# Patient Record
Sex: Female | Born: 1990 | Race: White | Marital: Single | Smoking: Never smoker
Health system: Southern US, Community
[De-identification: ages and names within clinical notes are randomized; demographics above are authoritative.]

## PROBLEM LIST (undated history)

## (undated) ENCOUNTER — Inpatient Hospital Stay: Payer: Self-pay

## (undated) DIAGNOSIS — F32A Depression, unspecified: Secondary | ICD-10-CM

## (undated) DIAGNOSIS — F419 Anxiety disorder, unspecified: Secondary | ICD-10-CM

## (undated) DIAGNOSIS — F329 Major depressive disorder, single episode, unspecified: Secondary | ICD-10-CM

## (undated) DIAGNOSIS — D649 Anemia, unspecified: Secondary | ICD-10-CM

## (undated) HISTORY — PX: DENTAL SURGERY: SHX609

---

## 2016-06-15 ENCOUNTER — Other Ambulatory Visit: Payer: Self-pay | Admitting: Advanced Practice Midwife

## 2016-06-15 DIAGNOSIS — O0991 Supervision of high risk pregnancy, unspecified, first trimester: Secondary | ICD-10-CM

## 2016-06-15 LAB — OB RESULTS CONSOLE RUBELLA ANTIBODY, IGM: RUBELLA: IMMUNE

## 2016-06-15 LAB — OB RESULTS CONSOLE VARICELLA ZOSTER ANTIBODY, IGG: Varicella: IMMUNE

## 2016-06-15 LAB — OB RESULTS CONSOLE HIV ANTIBODY (ROUTINE TESTING): HIV: NONREACTIVE

## 2016-06-16 LAB — OB RESULTS CONSOLE HEPATITIS B SURFACE ANTIGEN: Hepatitis B Surface Ag: NEGATIVE

## 2016-06-16 LAB — OB RESULTS CONSOLE RPR: RPR: REACTIVE

## 2016-06-20 ENCOUNTER — Ambulatory Visit
Admission: RE | Admit: 2016-06-20 | Discharge: 2016-06-20 | Disposition: A | Discharge status: Home / Self Care | Payer: Medicaid Other | Source: Ambulatory Visit | Attending: Advanced Practice Midwife | Admitting: Advanced Practice Midwife

## 2016-06-20 DIAGNOSIS — O209 Hemorrhage in early pregnancy, unspecified: Secondary | ICD-10-CM | POA: Diagnosis not present

## 2016-06-20 DIAGNOSIS — O3481 Maternal care for other abnormalities of pelvic organs, first trimester: Secondary | ICD-10-CM | POA: Diagnosis not present

## 2016-06-20 DIAGNOSIS — Z3A11 11 weeks gestation of pregnancy: Secondary | ICD-10-CM | POA: Insufficient documentation

## 2016-06-20 DIAGNOSIS — O26891 Other specified pregnancy related conditions, first trimester: Secondary | ICD-10-CM | POA: Insufficient documentation

## 2016-06-20 DIAGNOSIS — O0991 Supervision of high risk pregnancy, unspecified, first trimester: Secondary | ICD-10-CM

## 2016-06-20 DIAGNOSIS — N8312 Corpus luteum cyst of left ovary: Secondary | ICD-10-CM | POA: Insufficient documentation

## 2016-06-20 DIAGNOSIS — Z36 Encounter for antenatal screening of mother: Secondary | ICD-10-CM | POA: Diagnosis present

## 2016-10-04 ENCOUNTER — Other Ambulatory Visit: Payer: Self-pay | Admitting: Physician Assistant

## 2016-10-04 DIAGNOSIS — Z3482 Encounter for supervision of other normal pregnancy, second trimester: Secondary | ICD-10-CM

## 2016-10-10 ENCOUNTER — Ambulatory Visit: Admission: RE | Admit: 2016-10-10 | Payer: Medicaid Other | Source: Ambulatory Visit

## 2016-10-24 LAB — OB RESULTS CONSOLE HIV ANTIBODY (ROUTINE TESTING): HIV: NONREACTIVE

## 2016-10-25 LAB — OB RESULTS CONSOLE RPR: RPR: NONREACTIVE

## 2016-12-21 ENCOUNTER — Encounter: Payer: Self-pay | Admitting: *Deleted

## 2016-12-21 ENCOUNTER — Observation Stay
Admission: EM | Admit: 2016-12-21 | Discharge: 2016-12-21 | Disposition: A | Discharge status: Home / Self Care | Payer: Medicaid Other | Attending: Obstetrics and Gynecology | Admitting: Obstetrics and Gynecology

## 2016-12-21 DIAGNOSIS — Z3A37 37 weeks gestation of pregnancy: Secondary | ICD-10-CM | POA: Insufficient documentation

## 2016-12-21 DIAGNOSIS — O26893 Other specified pregnancy related conditions, third trimester: Secondary | ICD-10-CM | POA: Diagnosis not present

## 2016-12-21 DIAGNOSIS — R030 Elevated blood-pressure reading, without diagnosis of hypertension: Secondary | ICD-10-CM | POA: Insufficient documentation

## 2016-12-21 DIAGNOSIS — Z3689 Encounter for other specified antenatal screening: Secondary | ICD-10-CM

## 2016-12-21 DIAGNOSIS — R55 Syncope and collapse: Secondary | ICD-10-CM | POA: Insufficient documentation

## 2016-12-21 LAB — CBC
HCT: 34.7 % — ABNORMAL LOW (ref 35.0–47.0)
Hemoglobin: 11.9 g/dL — ABNORMAL LOW (ref 12.0–16.0)
MCH: 29.8 pg (ref 26.0–34.0)
MCHC: 34.3 g/dL (ref 32.0–36.0)
MCV: 86.8 fL (ref 80.0–100.0)
PLATELETS: 289 10*3/uL (ref 150–440)
RBC: 4 MIL/uL (ref 3.80–5.20)
RDW: 15.4 % — ABNORMAL HIGH (ref 11.5–14.5)
WBC: 10.7 10*3/uL (ref 3.6–11.0)

## 2016-12-21 LAB — URINALYSIS, ROUTINE W REFLEX MICROSCOPIC
BILIRUBIN URINE: NEGATIVE
Glucose, UA: NEGATIVE mg/dL
HGB URINE DIPSTICK: NEGATIVE
Ketones, ur: NEGATIVE mg/dL
NITRITE: NEGATIVE
PROTEIN: 30 mg/dL — AB
Specific Gravity, Urine: 1.024 (ref 1.005–1.030)
pH: 5 (ref 5.0–8.0)

## 2016-12-21 LAB — COMPREHENSIVE METABOLIC PANEL
ALT: 10 U/L — ABNORMAL LOW (ref 14–54)
ANION GAP: 8 (ref 5–15)
AST: 20 U/L (ref 15–41)
Albumin: 3 g/dL — ABNORMAL LOW (ref 3.5–5.0)
Alkaline Phosphatase: 121 U/L (ref 38–126)
BUN: 5 mg/dL — ABNORMAL LOW (ref 6–20)
CO2: 20 mmol/L — AB (ref 22–32)
Calcium: 9 mg/dL (ref 8.9–10.3)
Chloride: 107 mmol/L (ref 101–111)
Creatinine, Ser: 0.51 mg/dL (ref 0.44–1.00)
GFR calc non Af Amer: >60 mL/min (ref >60)
Glucose, Bld: 101 mg/dL — ABNORMAL HIGH (ref 65–99)
Potassium: 3.6 mmol/L (ref 3.5–5.1)
Sodium: 135 mmol/L (ref 135–145)
Total Bilirubin: 0.5 mg/dL (ref 0.3–1.2)
Total Protein: 7 g/dL (ref 6.5–8.1)

## 2016-12-21 LAB — PROTEIN / CREATININE RATIO, URINE
CREATININE, URINE: 250 mg/dL
Protein Creatinine Ratio: 0.07 mg/mg{Cre} (ref 0.00–0.15)
Total Protein, Urine: 17 mg/dL

## 2016-12-21 LAB — OB RESULTS CONSOLE GC/CHLAMYDIA
Chlamydia: NEGATIVE
Gonorrhea: NEGATIVE

## 2016-12-21 NOTE — OB Triage Provider Note (Signed)
Triage visit for NST   Envi Briggs is a 26 y.o. G1P0. She is at [redacted]w[redacted]d gestation. She presents for elevated BP in the office yesterday.  Indication: Elevated BP, now with syncope this morning x2.  S: Resting comfortably. no CTX, no VB. Active fetal movement. O:  BP 121/80   Pulse 93   Temp 97.9 F (36.6 C) (Oral)   Ht 5\' 7"  (1.702 m)   Wt 230 lb (104.3 kg)   LMP 04/04/2016 (LMP Unknown)   BMI 36.02 kg/m  Results for orders placed or performed during the hospital encounter of 12/21/16 (from the past 48 hour(s))  Urinalysis, Routine w reflex microscopic   Collection Time: 12/21/16 10:45 AM  Result Value Ref Range   Color, Urine AMBER (A) YELLOW   APPearance HAZY (A) CLEAR   Specific Gravity, Urine 1.024 1.005 - 1.030   pH 5.0 5.0 - 8.0   Glucose, UA NEGATIVE NEGATIVE mg/dL   Hgb urine dipstick NEGATIVE NEGATIVE   Bilirubin Urine NEGATIVE NEGATIVE   Ketones, ur NEGATIVE NEGATIVE mg/dL   Protein, ur 30 (A) NEGATIVE mg/dL   Nitrite NEGATIVE NEGATIVE   Leukocytes, UA SMALL (A) NEGATIVE   RBC / HPF 0-5 0 - 5 RBC/hpf   WBC, UA 6-30 0 - 5 WBC/hpf   Bacteria, UA FEW (A) NONE SEEN   Squamous Epithelial / LPF 0-5 (A) NONE SEEN   Mucous PRESENT   Protein / creatinine ratio, urine   Collection Time: 12/21/16 10:46 AM  Result Value Ref Range   Creatinine, Urine 250 mg/dL   Total Protein, Urine 17 mg/dL   Protein Creatinine Ratio 0.07 0.00 - 0.15 mg/mg[Cre]  CBC   Collection Time: 12/21/16 10:56 AM  Result Value Ref Range   WBC 10.7 3.6 - 11.0 K/uL   RBC 4.00 3.80 - 5.20 MIL/uL   Hemoglobin 11.9 (L) 12.0 - 16.0 g/dL   HCT 16.1 (L) 09.6 - 04.5 %   MCV 86.8 80.0 - 100.0 fL   MCH 29.8 26.0 - 34.0 pg   MCHC 34.3 32.0 - 36.0 g/dL   RDW 40.9 (H) 81.1 - 91.4 %   Platelets 289 150 - 440 K/uL  Comprehensive metabolic panel   Collection Time: 12/21/16 10:56 AM  Result Value Ref Range   Sodium 135 135 - 145 mmol/L   Potassium 3.6 3.5 - 5.1 mmol/L   Chloride 107 101 - 111 mmol/L    CO2 20 (L) 22 - 32 mmol/L   Glucose, Bld 101 (H) 65 - 99 mg/dL   BUN 5 (L) 6 - 20 mg/dL   Creatinine, Ser 7.82 0.44 - 1.00 mg/dL   Calcium 9.0 8.9 - 95.6 mg/dL   Total Protein 7.0 6.5 - 8.1 g/dL   Albumin 3.0 (L) 3.5 - 5.0 g/dL   AST 20 15 - 41 U/L   ALT 10 (L) 14 - 54 U/L   Alkaline Phosphatase 121 38 - 126 U/L   Total Bilirubin 0.5 0.3 - 1.2 mg/dL   GFR calc non Af Amer >60 >60 mL/min   GFR calc Af Amer >60 >60 mL/min   Anion gap 8 5 - 15     Gen: NAD, AAOx3      Abd: FNTTP      Ext: Non-tender, Nonedmeatous, no clonus Lungs: CTAB  NST  Baseline: 140 Variability: moderate Accelerations present x >2 Decelerations absent Time  Interpretation: reactive NST, category 1 tracing  A/P:  26 y.o. G1P0 [redacted]w[redacted]d with elevated BP but without  dx of HTN.    Reactive NST, with moderate variability and accelerations, no decels  Fetal Wellbeing: Reassuring  Recommend BP check on Monday. If sustained and Dx of gHTN, recommend delivery  PreE precuations given  FKC given  D/c home stable, precautions reviewed, follow-up as scheduled.   ----- Christeen DouglasBethany Jahayra Mazo, MD MPH Attending Obstetrician and Gynecologist [facility], Department of OB/GYN [facility]

## 2016-12-21 NOTE — Discharge Summary (Signed)
Patient discharged home, discharge instructions given, patient states understanding. Patient left floor in stable condition, denies any other needs at this time. Patient to keep next scheduled OB appointment for monday

## 2016-12-21 NOTE — Discharge Instructions (Signed)
Drink plenty of water and get plenty of rest. Call your provider for any other concerns °

## 2016-12-21 NOTE — OB Triage Note (Signed)
[facility] pressure [facility].  Pt also states she has had occas headaches, blurred vision (both she denies now) and states she blacked out twice this am and woke to be on the floor in the bathroom at 7am and then again in bedroom shortly after.  Denies any bleeding, leaking of fluid and states active fetal  Movement.  No edema noted in lower extremities, 2+ reflexes noted on exam with pt stating 4 on 0-10 scale when assessed for epigastric pain URQ>  Pt to have NST and assess blood pressures. Ellison Carwin Donivin Wirt RNC

## 2016-12-22 LAB — OB RESULTS CONSOLE GBS: STREP GROUP B AG: NEGATIVE

## 2017-01-13 ENCOUNTER — Telehealth: Payer: Self-pay | Admitting: *Deleted

## 2017-01-14 ENCOUNTER — Encounter: Payer: Self-pay | Admitting: *Deleted

## 2017-01-14 ENCOUNTER — Encounter
Admission: EM | Disposition: A | Discharge status: Home / Self Care | Payer: Self-pay | Source: Home / Self Care | Attending: Obstetrics & Gynecology

## 2017-01-14 ENCOUNTER — Inpatient Hospital Stay
Admission: EM | Admit: 2017-01-14 | Discharge: 2017-01-17 | DRG: 765 | Disposition: A | Discharge status: Home / Self Care | Payer: Medicaid Other | Attending: Obstetrics & Gynecology | Admitting: Obstetrics & Gynecology

## 2017-01-14 DIAGNOSIS — Z91040 Latex allergy status: Secondary | ICD-10-CM

## 2017-01-14 DIAGNOSIS — O4292 Full-term premature rupture of membranes, unspecified as to length of time between rupture and onset of labor: Secondary | ICD-10-CM | POA: Diagnosis present

## 2017-01-14 DIAGNOSIS — F419 Anxiety disorder, unspecified: Secondary | ICD-10-CM | POA: Diagnosis present

## 2017-01-14 DIAGNOSIS — Z3A41 41 weeks gestation of pregnancy: Secondary | ICD-10-CM | POA: Diagnosis not present

## 2017-01-14 DIAGNOSIS — O99214 Obesity complicating childbirth: Secondary | ICD-10-CM | POA: Diagnosis present

## 2017-01-14 DIAGNOSIS — Z880 Allergy status to penicillin: Secondary | ICD-10-CM | POA: Diagnosis not present

## 2017-01-14 DIAGNOSIS — O0993 Supervision of high risk pregnancy, unspecified, third trimester: Secondary | ICD-10-CM

## 2017-01-14 DIAGNOSIS — Z6833 Body mass index (BMI) 33.0-33.9, adult: Secondary | ICD-10-CM

## 2017-01-14 DIAGNOSIS — E669 Obesity, unspecified: Secondary | ICD-10-CM | POA: Diagnosis present

## 2017-01-14 DIAGNOSIS — O99210 Obesity complicating pregnancy, unspecified trimester: Secondary | ICD-10-CM | POA: Diagnosis present

## 2017-01-14 DIAGNOSIS — O134 Gestational [pregnancy-induced] hypertension without significant proteinuria, complicating childbirth: Secondary | ICD-10-CM | POA: Diagnosis present

## 2017-01-14 DIAGNOSIS — O2340 Unspecified infection of urinary tract in pregnancy, unspecified trimester: Secondary | ICD-10-CM | POA: Diagnosis not present

## 2017-01-14 DIAGNOSIS — O139 Gestational [pregnancy-induced] hypertension without significant proteinuria, unspecified trimester: Secondary | ICD-10-CM | POA: Diagnosis present

## 2017-01-14 DIAGNOSIS — O99344 Other mental disorders complicating childbirth: Secondary | ICD-10-CM | POA: Diagnosis present

## 2017-01-14 HISTORY — DX: Depression, unspecified: F32.A

## 2017-01-14 HISTORY — DX: Anxiety disorder, unspecified: F41.9

## 2017-01-14 HISTORY — DX: Major depressive disorder, single episode, unspecified: F32.9

## 2017-01-14 HISTORY — DX: Anemia, unspecified: D64.9

## 2017-01-14 LAB — COMPREHENSIVE METABOLIC PANEL
ALBUMIN: 3 g/dL — AB (ref 3.5–5.0)
ALT: 8 U/L — AB (ref 14–54)
AST: 22 U/L (ref 15–41)
Alkaline Phosphatase: 147 U/L — ABNORMAL HIGH (ref 38–126)
Anion gap: 9 (ref 5–15)
BILIRUBIN TOTAL: 0.3 mg/dL (ref 0.3–1.2)
BUN: 9 mg/dL (ref 6–20)
CO2: 20 mmol/L — ABNORMAL LOW (ref 22–32)
CREATININE: 0.57 mg/dL (ref 0.44–1.00)
Calcium: 9 mg/dL (ref 8.9–10.3)
Chloride: 106 mmol/L (ref 101–111)
GFR calc Af Amer: >60 mL/min (ref >60)
GLUCOSE: 108 mg/dL — AB (ref 65–99)
Potassium: 4 mmol/L (ref 3.5–5.1)
Sodium: 135 mmol/L (ref 135–145)
TOTAL PROTEIN: 7 g/dL (ref 6.5–8.1)

## 2017-01-14 LAB — CBC
HCT: 37.9 % (ref 35.0–47.0)
Hemoglobin: 12.9 g/dL (ref 12.0–16.0)
MCH: 29.7 pg (ref 26.0–34.0)
MCHC: 34 g/dL (ref 32.0–36.0)
MCV: 87.3 fL (ref 80.0–100.0)
PLATELETS: 238 10*3/uL (ref 150–440)
RBC: 4.34 MIL/uL (ref 3.80–5.20)
RDW: 15.5 % — AB (ref 11.5–14.5)
WBC: 10.1 10*3/uL (ref 3.6–11.0)

## 2017-01-14 LAB — CHLAMYDIA/NGC RT PCR (ARMC ONLY)
Chlamydia Tr: NOT DETECTED
N gonorrhoeae: NOT DETECTED

## 2017-01-14 LAB — URINE DRUG SCREEN, QUALITATIVE (ARMC ONLY)
Amphetamines, Ur Screen: NOT DETECTED
BARBITURATES, UR SCREEN: NOT DETECTED
Benzodiazepine, Ur Scrn: NOT DETECTED
CANNABINOID 50 NG, UR ~~LOC~~: NOT DETECTED
Cocaine Metabolite,Ur ~~LOC~~: NOT DETECTED
MDMA (ECSTASY) UR SCREEN: NOT DETECTED
Methadone Scn, Ur: NOT DETECTED
OPIATE, UR SCREEN: NOT DETECTED
PHENCYCLIDINE (PCP) UR S: NOT DETECTED
Tricyclic, Ur Screen: NOT DETECTED

## 2017-01-14 LAB — PROTEIN / CREATININE RATIO, URINE
CREATININE, URINE: 71 mg/dL
Total Protein, Urine: <6 mg/dL

## 2017-01-14 LAB — TYPE AND SCREEN
ABO/RH(D): A POS
Antibody Screen: NEGATIVE

## 2017-01-14 SURGERY — Surgical Case
Anesthesia: Spinal

## 2017-01-14 SURGERY — Surgical Case
Anesthesia: Spinal | Site: Abdomen | Wound class: Clean Contaminated

## 2017-01-14 MED ORDER — LACTATED RINGERS IV SOLN: 500.0000 mL | INTRAVENOUS | Status: DC | PRN

## 2017-01-14 MED ORDER — ACETAMINOPHEN 325 MG PO TABS: 650.0000 mg | ORAL_TABLET | ORAL | Status: DC | PRN

## 2017-01-14 MED ORDER — LIDOCAINE HCL (PF) 1 % IJ SOLN
INTRAMUSCULAR | Status: AC
  Filled 2017-01-14: qty 30

## 2017-01-14 MED ORDER — MISOPROSTOL 200 MCG PO TABS
ORAL_TABLET | ORAL | Status: AC
  Filled 2017-01-14: qty 4

## 2017-01-14 MED ORDER — CLINDAMYCIN PHOSPHATE 900 MG/50ML IV SOLN
900.0000 mg | INTRAVENOUS | Status: AC
  Administered 2017-01-14: 900 mg via INTRAVENOUS

## 2017-01-14 MED ORDER — LACTATED RINGERS IV SOLN
INTRAVENOUS | Status: DC
  Administered 2017-01-14 (×2): via INTRAVENOUS

## 2017-01-14 MED ORDER — ONDANSETRON HCL 4 MG/2ML IJ SOLN: 4.0000 mg | Freq: Four times a day (QID) | INTRAMUSCULAR | Status: DC | PRN

## 2017-01-14 MED ORDER — PHENYLEPHRINE HCL 10 MG/ML IJ SOLN
INTRAMUSCULAR | Status: AC
  Filled 2017-01-14: qty 1

## 2017-01-14 MED ORDER — LACTATED RINGERS IV SOLN
INTRAVENOUS | Status: DC
  Administered 2017-01-14: 100 mL/h via INTRAUTERINE

## 2017-01-14 MED ORDER — BUPIVACAINE HCL (PF) 0.5 % IJ SOLN
INTRAMUSCULAR | Status: AC
  Filled 2017-01-14: qty 30

## 2017-01-14 MED ORDER — BUTORPHANOL TARTRATE 1 MG/ML IJ SOLN
1.0000 mg | INTRAMUSCULAR | Status: DC
  Administered 2017-01-14: 1 mg via INTRAVENOUS

## 2017-01-14 MED ORDER — BUTORPHANOL TARTRATE 1 MG/ML IJ SOLN
INTRAMUSCULAR | Status: AC
  Administered 2017-01-14: 1 mg via INTRAVENOUS
  Filled 2017-01-14: qty 1

## 2017-01-14 MED ORDER — LIDOCAINE HCL (PF) 1 % IJ SOLN: 30.0000 mL | INTRAMUSCULAR | Status: DC | PRN

## 2017-01-14 MED ORDER — TERBUTALINE SULFATE 1 MG/ML IJ SOLN: 0.2500 mg | Freq: Once | INTRAMUSCULAR | Status: DC | PRN

## 2017-01-14 MED ORDER — FENTANYL CITRATE (PF) 100 MCG/2ML IJ SOLN
INTRAMUSCULAR | Status: AC
  Filled 2017-01-14: qty 2

## 2017-01-14 MED ORDER — OXYCODONE-ACETAMINOPHEN 5-325 MG PO TABS: 2.0000 | ORAL_TABLET | ORAL | Status: DC | PRN

## 2017-01-14 MED ORDER — PROPOFOL 10 MG/ML IV BOLUS
INTRAVENOUS | Status: AC
  Filled 2017-01-14: qty 20

## 2017-01-14 MED ORDER — BUPIVACAINE HCL (PF) 0.5 % IJ SOLN: 30.0000 mL | Freq: Once | INTRAMUSCULAR | Status: DC

## 2017-01-14 MED ORDER — OXYTOCIN 40 UNITS IN LACTATED RINGERS INFUSION - SIMPLE MED
2.5000 [IU]/h | INTRAVENOUS | Status: DC
  Filled 2017-01-14: qty 1000

## 2017-01-14 MED ORDER — BUPIVACAINE LIPOSOME 1.3 % IJ SUSP
INTRAMUSCULAR | Status: AC
  Filled 2017-01-14: qty 20

## 2017-01-14 MED ORDER — CLINDAMYCIN PHOSPHATE 900 MG/50ML IV SOLN
INTRAVENOUS | Status: AC
  Administered 2017-01-14: 900 mg via INTRAVENOUS
  Filled 2017-01-14: qty 50

## 2017-01-14 MED ORDER — AMMONIA AROMATIC IN INHA
RESPIRATORY_TRACT | Status: AC
  Filled 2017-01-14: qty 10

## 2017-01-14 MED ORDER — MORPHINE SULFATE (PF) 0.5 MG/ML IJ SOLN
INTRAMUSCULAR | Status: AC
  Filled 2017-01-14: qty 10

## 2017-01-14 MED ORDER — ACETAMINOPHEN 650 MG RE SUPP
650.0000 mg | Freq: Once | RECTAL | Status: AC
  Administered 2017-01-15: 650 mg via RECTAL

## 2017-01-14 MED ORDER — GENTAMICIN SULFATE 40 MG/ML IJ SOLN
5.0000 mg/kg | INTRAMUSCULAR | Status: DC
  Filled 2017-01-14: qty 13.25

## 2017-01-14 MED ORDER — OXYTOCIN 40 UNITS IN LACTATED RINGERS INFUSION - SIMPLE MED
INTRAVENOUS | Status: AC
  Administered 2017-01-14: 1 m[IU]/min via INTRAVENOUS
  Filled 2017-01-14: qty 1000

## 2017-01-14 MED ORDER — OXYTOCIN 10 UNIT/ML IJ SOLN
INTRAMUSCULAR | Status: AC
  Filled 2017-01-14: qty 2

## 2017-01-14 MED ORDER — SOD CITRATE-CITRIC ACID 500-334 MG/5ML PO SOLN
30.0000 mL | ORAL | Status: DC | PRN
  Administered 2017-01-14: 30 mL via ORAL
  Filled 2017-01-14: qty 30

## 2017-01-14 MED ORDER — SODIUM CHLORIDE 0.9 % IJ SOLN
INTRAMUSCULAR | Status: AC
  Filled 2017-01-14: qty 50

## 2017-01-14 MED ORDER — BUPIVACAINE LIPOSOME 1.3 % IJ SUSP: 20.0000 mL | Freq: Once | INTRAMUSCULAR | Status: DC

## 2017-01-14 MED ORDER — OXYTOCIN BOLUS FROM INFUSION: 500.0000 mL | Freq: Once | INTRAVENOUS | Status: DC

## 2017-01-14 MED ORDER — OXYTOCIN 40 UNITS IN LACTATED RINGERS INFUSION - SIMPLE MED
1.0000 m[IU]/min | INTRAVENOUS | Status: DC
  Administered 2017-01-14: 4 m[IU]/min via INTRAVENOUS
  Administered 2017-01-14 (×2): 1 m[IU]/min via INTRAVENOUS

## 2017-01-14 MED ORDER — OXYCODONE-ACETAMINOPHEN 5-325 MG PO TABS: 1.0000 | ORAL_TABLET | ORAL | Status: DC | PRN

## 2017-01-14 SURGICAL SUPPLY — 34 items
CANISTER SUCT 3000ML (MISCELLANEOUS) ×3 IMPLANT
CATH KIT ON-Q SILVERSOAK 5IN (CATHETERS) IMPLANT
CLOSURE WOUND 1/2 X4 (GAUZE/BANDAGES/DRESSINGS)
DERMABOND ADVANCED (GAUZE/BANDAGES/DRESSINGS) ×4
DERMABOND ADVANCED .7 DNX12 (GAUZE/BANDAGES/DRESSINGS) ×2 IMPLANT
DRSG TELFA 3X8 NADH (GAUZE/BANDAGES/DRESSINGS) IMPLANT
ELECT CAUTERY BLADE 6.4 (BLADE) ×3 IMPLANT
ELECT REM PT RETURN 9FT ADLT (ELECTROSURGICAL) ×3
ELECTRODE REM PT RTRN 9FT ADLT (ELECTROSURGICAL) ×1 IMPLANT
GAUZE SPONGE 4X4 12PLY STRL (GAUZE/BANDAGES/DRESSINGS) IMPLANT
GLOVE PI ORTHOPRO 6.5 (GLOVE) ×4
GLOVE PI ORTHOPRO STRL 6.5 (GLOVE) ×2 IMPLANT
GLOVE SURG SYN 6.5 ES PF (GLOVE) ×12 IMPLANT
GOWN STRL REUS W/ TWL LRG LVL3 (GOWN DISPOSABLE) ×3 IMPLANT
GOWN STRL REUS W/TWL LRG LVL3 (GOWN DISPOSABLE) ×6
NS IRRIG 1000ML POUR BTL (IV SOLUTION) ×3 IMPLANT
PACK C SECTION AR (MISCELLANEOUS) ×3 IMPLANT
PAD OB MATERNITY 4.3X12.25 (PERSONAL CARE ITEMS) ×6 IMPLANT
PAD PREP 24X41 OB/GYN DISP (PERSONAL CARE ITEMS) ×3 IMPLANT
RTRCTR C-SECT PINK 25CM LRG (MISCELLANEOUS) ×3 IMPLANT
SPONGE LAP 18X18 5 PK (GAUZE/BANDAGES/DRESSINGS) ×6 IMPLANT
STAPLER INSORB 30 2030 C-SECTI (MISCELLANEOUS) ×3 IMPLANT
STRAP SAFETY BODY (MISCELLANEOUS) ×3 IMPLANT
STRIP CLOSURE SKIN 1/2X4 (GAUZE/BANDAGES/DRESSINGS) IMPLANT
SUT MNCRL 4-0 (SUTURE) ×2
SUT MNCRL 4-0 27XMFL (SUTURE) ×1
SUT PDS AB 1 TP1 96 (SUTURE) ×3 IMPLANT
SUT VIC AB 0 CT1 36 (SUTURE) ×6 IMPLANT
SUT VIC AB 2-0 CT1 27 (SUTURE) ×2
SUT VIC AB 2-0 CT1 TAPERPNT 27 (SUTURE) ×1 IMPLANT
SUT VIC AB 3-0 SH 27 (SUTURE) ×2
SUT VIC AB 3-0 SH 27X BRD (SUTURE) ×1 IMPLANT
SUTURE MNCRL 4-0 27XMF (SUTURE) ×1 IMPLANT
SWABSTK COMLB BENZOIN TINCTURE (MISCELLANEOUS) IMPLANT

## 2017-01-14 NOTE — Progress Notes (Signed)
Decision for cesarean G2P0010 @ 41.0 Patient's last menstrual period was 04/04/2016 Estimated Date of Delivery: 01/07/17   Patient presented with ROM since 0800 for clear fluid, is s/p pitocin augmentation, with recurrent lates and  variables while on pitocin, requiring multiple discontinuations of this induction method.  She has IUPC and amnioinfusion currently running.  Since the pitocin has been discontinued, she has not made cervical change and, though frequent contractions, are not >100 MV units in sequential 10 minute periods.  She has been at 5cm since 9:00pm, without change since then.  With the pitocin off, there have been a few small and inconsequential variables, none prolonged, and multiple accelerations, thus acidemia is unlikely.    My recommendation to the patient was to consider her options,  -primary cesarean now, while baby heart tracing is uncompromised -wait another hour and see if any cervical change happens, if so, we continue without pitocin,  -try pitocin again, and if recurrent or persistent lates or prolonged variables again occur we then proceed with cesarean, with understanding that emergency cesarean may be needed should the baby not tolerate stronger contractions, and fetal compromise is possible with stress on placenta.   This was discussed with patient and family, and after about 45 minutes the patient decided to proceed with cesarean.  I rechecked her cervix at that time and it was unchanged.  Staff, OR, nursing, and anesthesia have been notified of decision and we will proceed when they are ready.    ----- Ranae Plumber, MD Attending Obstetrician and Gynecologist [facility], Department of OB/GYN [facility]

## 2017-01-14 NOTE — Anesthesia Preprocedure Evaluation (Signed)
Anesthesia Evaluation  Patient identified by MRN, date of birth, ID band Patient awake    Reviewed: Allergy & Precautions, H&P , NPO status , Patient's Chart, lab work & pertinent test results  History of Anesthesia Complications Negative for: history of anesthetic complications  Airway Mallampati: III  TM Distance: >3 FB Neck ROM: full    Dental  (+) Poor Dentition, Chipped   Pulmonary neg pulmonary ROS, neg shortness of breath,    Pulmonary exam normal breath sounds clear to auscultation       Cardiovascular Exercise Tolerance: Good (-) angina(-) Past MI and (-) DOE negative cardio ROS Normal cardiovascular exam Rhythm:regular Rate:Normal     Neuro/Psych PSYCHIATRIC DISORDERS Anxiety Depression    GI/Hepatic negative GI ROS, neg GERD  ,  Endo/Other    Renal/GU   negative genitourinary   Musculoskeletal   Abdominal   Peds  Hematology negative hematology ROS (+)   Anesthesia Other Findings Past Medical History: No date: Anemia No date: Anxiety No date: Depression  Past Surgical History: No date: DENTAL SURGERY  BMI    Body Mass Index:  36.81 kg/m      Reproductive/Obstetrics (+) Pregnancy                             Anesthesia Physical Anesthesia Plan  ASA: III  Anesthesia Plan: Spinal   Post-op Pain Management:    Induction:   Airway Management Planned:   Additional Equipment:   Intra-op Plan:   Post-operative Plan:   Informed Consent: I have reviewed the patients History and Physical, chart, labs and discussed the procedure including the risks, benefits and alternatives for the proposed anesthesia with the patient or authorized representative who has indicated his/her understanding and acceptance.   Dental Advisory Given  Plan Discussed with: Anesthesiologist  Anesthesia Plan Comments:         Anesthesia Quick Evaluation

## 2017-01-14 NOTE — H&P (Signed)
OB History & Physical   History of Present Illness:  Chief Complaint:   HPI:  Heather Blackwell is a 26 y.o. G2P0010 female at [redacted]w[redacted]d dated by 11wk [place], with EDC of 01/07/17.  LMP was unreliable, but c/w EDC, 04/04/16.  She presents to L&D for ROM for clear fluid  +FM, no CTX, +LOF, no VB  Pregnancy Issues: 1.  UTI in pregnancy 2. False positive syphilis serology 3. Anemia  4. Obesity, BMI 33 pre-pregnancy 5. PCN, GELATIN and LATEX allergic 6. Had isolated elevated BPs but no diagnosis of GHTN 7. Occasional missed appointments, did not present for ordered NST yesterday.   Maternal Medical History:   Past Medical History:  Diagnosis Date  . Anemia   . Anxiety   . Depression     Past Surgical History:  Procedure Laterality Date  . DENTAL SURGERY      Allergies  Allergen Reactions  . Gelatin Anaphylaxis    Pt states her throat closes  . Latex Itching and Swelling  . Penicillins Rash    Prior to Admission medications   Medication Sig Start Date End Date Taking? Authorizing Provider  Prenatal Vit-Fe Fumarate-FA (MULTIVITAMIN-PRENATAL) 27-0.8 MG TABS tablet Take 1 tablet by mouth daily at 12 noon.   Yes Historical Provider, MD     Prenatal care site: [facility]   Social History: She  reports that she has never smoked. She has never used smokeless tobacco. She reports that she does not drink alcohol or use drugs.  Family History: family history is not on file.   Review of Systems: A full review of systems was performed and negative except as noted in the HPI.     Physical Exam:  Vital Signs: BP 136/85 (BP Location: Right Arm)   Pulse (!) 114   Temp 97.9 F (36.6 C) (Oral)   Resp 16   Ht  (1.702 m)   Wt 106.6 kg (235 lb)   LMP 04/04/2016 (LMP Unknown)   BMI 36.81 kg/m  General: no acute distress.  HEENT: normocephalic, atraumatic Heart: regular rate & rhythm.  No murmurs/rubs/gallops Lungs: clear to auscultation bilaterally, normal  respiratory effort Abdomen: soft, gravid, non-tender;  EFW: 7.2 Pelvic:   External: Normal external female genitalia  Cervix: Dilation: 2 / Effacement (%): 50 / Station: -2, -3    Extremities: non-tender, symmetric,2+ edema bilaterally.  DTRs: 2+ Neurologic: Alert & oriented x 3.    Results for orders placed or performed during the hospital encounter of 01/14/17 (from the past 24 hour(s))  CBC     Status: Abnormal   Collection Time: 01/14/17 11:11 AM  Result Value Ref Range   WBC 10.1 3.6 - 11.0 K/uL   RBC 4.34 3.80 - 5.20 MIL/uL   Hemoglobin 12.9 12.0 - 16.0 g/dL   HCT 40.9 81.1 - 91.4 %   MCV 87.3 80.0 - 100.0 fL   MCH 29.7 26.0 - 34.0 pg   MCHC 34.0 32.0 - 36.0 g/dL   RDW 78.2 (H) 95.6 - 21.3 %   Platelets 238 150 - 440 K/uL  Type and screen [facility]     Status: None (Preliminary result)   Collection Time: 01/14/17 11:11 AM  Result Value Ref Range   ABO/RH(D) PENDING    Antibody Screen PENDING    Sample Expiration 01/17/2017     Pertinent Results:  Prenatal Labs: Blood type/Rh A+  Antibody screen neg  Rubella Immune  Varicella Immune  RPR NR  HBsAg  Neg  HIV NR  GC neg  Chlamydia neg  Genetic screening Not done  1 hour GTT 99, 103  3 hour GTT --  GBS Negative  12/22/16   FHT:  150 mod + accels no decels TOCO: 3-1min SVE:  Dilation: 2 / Effacement (%): 50 / Station: -2, -3    Cephalic by leopolds   Assessment:  Heather Blackwell is a 26 y.o. G2P0010 female at [redacted]w[redacted]d with ROM.   Plan:  1. Admit to Labor & Delivery 2. CBC, T&S, Clrs, IVF 3. GBS  Neg.  4. Consents obtained. 5. Continuous efm/toco 6. Expectant management, will consider PGE induction if doesn't go into labor in the next few hours.  7. Category 1 tracing   ----- Ranae Plumber, MD Attending Obstetrician and Gynecologist [facility], Department of OB/GYN [facility]

## 2017-01-14 NOTE — OB Triage Note (Signed)
Pt. here for possible SROM. Reports a trickle of fluid since 0750 this am continuing and having to wear a pad. Reports clear fluid. Denies any odor, denies vaginal bleeding. Heather Blackwell

## 2017-01-14 NOTE — Plan of Care (Signed)
Report to MD.  Informed that pt has had several recent late/variable decelerations in conjunction with her labor.  Informed that Pitocin had been turned off, bolus infused, O2 10L per face mask given and then after a break Pitocin was restarted at 1 mu/min and had to be turned off again within a few minutes due to intolerance of labor.  MD states she will be in to assess pt and tracing. Ellison Carwin RNC

## 2017-01-14 NOTE — Progress Notes (Signed)
Intrapartum progress note:  Notified by nursing of prolonged decelerations to 90s when patient ambulates to bathroom. Returns to baseline spontaneously, but slowly.  S: resting comfortably, no complaints. Denies: HA, visual changes, SOB, or RUQ/epigastric pain  O: BP 119/70 (BP Location: Left Arm)   Pulse 77   Temp 98.2 F (36.8 C) (Oral)   Resp 16   Ht  (1.702 m)   Wt 106.6 kg (235 lb)   LMP 04/04/2016 (LMP Unknown)   BMI 36.81 kg/m   FHT  145 mod + accels + variables TOCO: q5-7 min SVE: 4/70/-2 IUPC and FSE placed  Results for orders placed or performed during the hospital encounter of 01/14/17 (from the past 24 hour(s))  CBC     Status: Abnormal   Collection Time: 01/14/17 11:11 AM  Result Value Ref Range   WBC 10.1 3.6 - 11.0 K/uL   RBC 4.34 3.80 - 5.20 MIL/uL   Hemoglobin 12.9 12.0 - 16.0 g/dL   HCT 16.1 09.6 - 04.5 %   MCV 87.3 80.0 - 100.0 fL   MCH 29.7 26.0 - 34.0 pg   MCHC 34.0 32.0 - 36.0 g/dL   RDW 40.9 (H) 81.1 - 91.4 %   Platelets 238 150 - 440 K/uL  Type and screen [facility]     Status: None   Collection Time: 01/14/17 11:11 AM  Result Value Ref Range   ABO/RH(D) A POS    Antibody Screen NEG    Sample Expiration 01/17/2017   Urine Drug Screen, Qualitative ([facility] only)     Status: None   Collection Time: 01/14/17 11:11 AM  Result Value Ref Range   Tricyclic, Ur Screen NONE DETECTED NONE DETECTED   Amphetamines, Ur Screen NONE DETECTED NONE DETECTED   MDMA (Ecstasy)Ur Screen NONE DETECTED NONE DETECTED   Cocaine Metabolite,Ur [place] NONE DETECTED NONE DETECTED   Opiate, Ur Screen NONE DETECTED NONE DETECTED   Phencyclidine (PCP) Ur S NONE DETECTED NONE DETECTED   Cannabinoid 50 Ng, Ur [place] NONE DETECTED NONE DETECTED   Barbiturates, Ur Screen NONE DETECTED NONE DETECTED   Benzodiazepine, Ur Scrn NONE DETECTED NONE DETECTED   Methadone Scn, Ur NONE DETECTED NONE DETECTED  Chlamydia/NGC rt PCR ([facility] only)     Status: None   Collection Time: 01/14/17 11:11 AM  Result Value Ref Range   Specimen source GC/Chlam URINE, RANDOM    Chlamydia Tr NOT DETECTED NOT DETECTED   N gonorrhoeae NOT DETECTED NOT DETECTED  Comprehensive metabolic panel     Status: Abnormal   Collection Time: 01/14/17 11:11 AM  Result Value Ref Range   Sodium 135 135 - 145 mmol/L   Potassium 4.0 3.5 - 5.1 mmol/L   Chloride 106 101 - 111 mmol/L   CO2 20 (L) 22 - 32 mmol/L   Glucose, Bld 108 (H) 65 - 99 mg/dL   BUN 9 6 - 20 mg/dL   Creatinine, Ser 7.82 0.44 - 1.00 mg/dL   Calcium 9.0 8.9 - 95.6 mg/dL   Total Protein 7.0 6.5 - 8.1 g/dL   Albumin 3.0 (L) 3.5 - 5.0 g/dL   AST 22 15 - 41 U/L   ALT 8 (L) 14 - 54 U/L   Alkaline Phosphatase 147 (H) 38 - 126 U/L   Total Bilirubin 0.3 0.3 - 1.2 mg/dL   GFR calc non Af Amer >60 >60 mL/min   GFR calc Af Amer >60 >60 mL/min   Anion gap 9 5 - 15  Protein /  creatinine ratio, urine     Status: None   Collection Time: 01/14/17 11:11 AM  Result Value Ref Range   Creatinine, Urine 71 mg/dL   Total Protein, Urine <6 mg/dL   Protein Creatinine Ratio        0.00 - 0.15 mg/mg[Cre]   A/P: 25yo G2P0 @ 41.0 weeks with ROM 1. IUP: category 2 tracing, but likely no acidemia as evidenced by accelerations 2. IUPC inserted for amnioinfusion and contraction monitoring.  Will observe, and if able, will consider pitocin. 3. Continue cautious observation.  Reasons for cesarean discussed with patient.    ----- Ranae Plumber, MD Attending Obstetrician and Gynecologist [facility], Department of OB/GYN [facility]

## 2017-01-15 ENCOUNTER — Inpatient Hospital Stay: Payer: Medicaid Other | Admitting: Anesthesiology

## 2017-01-15 DIAGNOSIS — O99210 Obesity complicating pregnancy, unspecified trimester: Secondary | ICD-10-CM | POA: Diagnosis present

## 2017-01-15 DIAGNOSIS — O139 Gestational [pregnancy-induced] hypertension without significant proteinuria, unspecified trimester: Secondary | ICD-10-CM | POA: Diagnosis present

## 2017-01-15 DIAGNOSIS — O2340 Unspecified infection of urinary tract in pregnancy, unspecified trimester: Secondary | ICD-10-CM | POA: Diagnosis not present

## 2017-01-15 DIAGNOSIS — O0993 Supervision of high risk pregnancy, unspecified, third trimester: Secondary | ICD-10-CM

## 2017-01-15 LAB — CBC
HEMATOCRIT: 31.6 % — AB (ref 35.0–47.0)
Hemoglobin: 10.4 g/dL — ABNORMAL LOW (ref 12.0–16.0)
MCH: 28.3 pg (ref 26.0–34.0)
MCHC: 33 g/dL (ref 32.0–36.0)
MCV: 86 fL (ref 80.0–100.0)
Platelets: 242 10*3/uL (ref 150–440)
RBC: 3.67 MIL/uL — ABNORMAL LOW (ref 3.80–5.20)
RDW: 15.4 % — AB (ref 11.5–14.5)
WBC: 15.2 10*3/uL — ABNORMAL HIGH (ref 3.6–11.0)

## 2017-01-15 LAB — RPR: RPR Ser Ql: NONREACTIVE

## 2017-01-15 MED ORDER — FENTANYL CITRATE (PF) 100 MCG/2ML IJ SOLN
25.0000 ug | INTRAMUSCULAR | Status: DC | PRN
  Administered 2017-01-15 (×2): 50 ug via INTRAVENOUS

## 2017-01-15 MED ORDER — NALBUPHINE HCL 10 MG/ML IJ SOLN: 5.0000 mg | Freq: Once | INTRAMUSCULAR | Status: DC | PRN

## 2017-01-15 MED ORDER — COCONUT OIL OIL: 1.0000 "application " | TOPICAL_OIL | Status: DC | PRN

## 2017-01-15 MED ORDER — NALBUPHINE HCL 10 MG/ML IJ SOLN: 5.0000 mg | INTRAMUSCULAR | Status: DC | PRN

## 2017-01-15 MED ORDER — MIDAZOLAM HCL 2 MG/2ML IJ SOLN
INTRAMUSCULAR | Status: AC
  Filled 2017-01-15: qty 2

## 2017-01-15 MED ORDER — ACETAMINOPHEN 325 MG PO TABS: 650.0000 mg | ORAL_TABLET | ORAL | Status: DC | PRN

## 2017-01-15 MED ORDER — NALOXONE HCL 0.4 MG/ML IJ SOLN: 0.4000 mg | INTRAMUSCULAR | Status: DC | PRN

## 2017-01-15 MED ORDER — WITCH HAZEL-GLYCERIN EX PADS: 1.0000 "application " | MEDICATED_PAD | CUTANEOUS | Status: DC | PRN

## 2017-01-15 MED ORDER — GENTAMICIN SULFATE 40 MG/ML IJ SOLN
INTRAVENOUS | Status: DC | PRN
  Administered 2017-01-15: 533.2 mg via INTRAVENOUS

## 2017-01-15 MED ORDER — OXYTOCIN 40 UNITS IN LACTATED RINGERS INFUSION - SIMPLE MED
INTRAVENOUS | Status: DC | PRN
  Administered 2017-01-15: 1 mL via INTRAVENOUS

## 2017-01-15 MED ORDER — OXYCODONE HCL 5 MG/5ML PO SOLN: 5.0000 mg | Freq: Once | ORAL | Status: DC | PRN

## 2017-01-15 MED ORDER — SODIUM CHLORIDE 0.9 % IV SOLN: 250.0000 mL | INTRAVENOUS | Status: DC

## 2017-01-15 MED ORDER — ACETAMINOPHEN 500 MG PO TABS
1000.0000 mg | ORAL_TABLET | Freq: Four times a day (QID) | ORAL | Status: AC
  Administered 2017-01-15 – 2017-01-16 (×4): 1000 mg via ORAL
  Filled 2017-01-15 (×5): qty 2

## 2017-01-15 MED ORDER — NALBUPHINE HCL 10 MG/ML IJ SOLN
5.0000 mg | INTRAMUSCULAR | Status: DC | PRN
  Administered 2017-01-15: 5 mg via INTRAVENOUS
  Filled 2017-01-15: qty 1

## 2017-01-15 MED ORDER — DIPHENHYDRAMINE HCL 25 MG PO CAPS: 25.0000 mg | ORAL_CAPSULE | Freq: Four times a day (QID) | ORAL | Status: DC | PRN

## 2017-01-15 MED ORDER — NALOXONE HCL 2 MG/2ML IJ SOSY: 1.0000 ug/kg/h | PREFILLED_SYRINGE | INTRAVENOUS | Status: DC | PRN

## 2017-01-15 MED ORDER — MORPHINE SULFATE (PF) 0.5 MG/ML IJ SOLN
INTRAMUSCULAR | Status: DC | PRN
  Administered 2017-01-15: 4 mg via EPIDURAL
  Administered 2017-01-15: .1 mg via INTRATHECAL

## 2017-01-15 MED ORDER — SODIUM CHLORIDE 0.9% FLUSH: 3.0000 mL | INTRAVENOUS | Status: DC | PRN

## 2017-01-15 MED ORDER — ONDANSETRON HCL 4 MG/2ML IJ SOLN: 4.0000 mg | Freq: Three times a day (TID) | INTRAMUSCULAR | Status: DC | PRN

## 2017-01-15 MED ORDER — KETOROLAC TROMETHAMINE 30 MG/ML IJ SOLN: 30.0000 mg | Freq: Four times a day (QID) | INTRAMUSCULAR | Status: AC | PRN

## 2017-01-15 MED ORDER — PRENATAL MULTIVITAMIN CH
1.0000 | ORAL_TABLET | Freq: Every day | ORAL | Status: DC
  Administered 2017-01-15 – 2017-01-17 (×3): 1 via ORAL
  Filled 2017-01-15 (×3): qty 1

## 2017-01-15 MED ORDER — OXYCODONE HCL 5 MG PO TABS
5.0000 mg | ORAL_TABLET | ORAL | Status: DC | PRN
  Administered 2017-01-16: 5 mg via ORAL
  Filled 2017-01-15: qty 1

## 2017-01-15 MED ORDER — BUPIVACAINE LIPOSOME 1.3 % IJ SUSP
INTRAMUSCULAR | Status: DC | PRN
  Administered 2017-01-15: 70 mL

## 2017-01-15 MED ORDER — LACTATED RINGERS IV SOLN
INTRAVENOUS | Status: DC
  Administered 2017-01-15 – 2017-01-16 (×3): via INTRAVENOUS

## 2017-01-15 MED ORDER — SODIUM CHLORIDE 0.9% FLUSH: 3.0000 mL | Freq: Two times a day (BID) | INTRAVENOUS | Status: DC

## 2017-01-15 MED ORDER — IBUPROFEN 600 MG PO TABS
600.0000 mg | ORAL_TABLET | Freq: Four times a day (QID) | ORAL | Status: DC
  Administered 2017-01-15 – 2017-01-17 (×10): 600 mg via ORAL
  Filled 2017-01-15 (×10): qty 1

## 2017-01-15 MED ORDER — LIDOCAINE HCL (PF) 2 % IJ SOLN
INTRAMUSCULAR | Status: DC | PRN
  Administered 2017-01-15: 5 mL via EPIDURAL
  Administered 2017-01-15: 3 mL via INTRADERMAL
  Administered 2017-01-15 (×4): 5 mL via EPIDURAL
  Administered 2017-01-15: 2 mL via EPIDURAL

## 2017-01-15 MED ORDER — DIPHENHYDRAMINE HCL 50 MG/ML IJ SOLN: 12.5000 mg | INTRAMUSCULAR | Status: DC | PRN

## 2017-01-15 MED ORDER — OXYCODONE HCL 5 MG PO TABS: 5.0000 mg | ORAL_TABLET | Freq: Once | ORAL | Status: DC | PRN

## 2017-01-15 MED ORDER — BUPIVACAINE IN DEXTROSE 0.75-8.25 % IT SOLN
INTRATHECAL | Status: DC | PRN
  Administered 2017-01-15: 1.8 mL via INTRATHECAL

## 2017-01-15 MED ORDER — OXYCODONE HCL 5 MG PO TABS: 5.0000 mg | ORAL_TABLET | Freq: Four times a day (QID) | ORAL | Status: AC | PRN

## 2017-01-15 MED ORDER — ONDANSETRON HCL 4 MG/2ML IJ SOLN
INTRAMUSCULAR | Status: DC | PRN
  Administered 2017-01-15: 4 mg via INTRAVENOUS

## 2017-01-15 MED ORDER — BUPIVACAINE HCL 0.5 % IJ SOLN
INTRAMUSCULAR | Status: DC | PRN
  Administered 2017-01-15: 30 mL

## 2017-01-15 MED ORDER — LIDOCAINE-EPINEPHRINE (PF) 1.5 %-1:200000 IJ SOLN
INTRAMUSCULAR | Status: DC | PRN
  Administered 2017-01-15: 3 mL via EPIDURAL

## 2017-01-15 MED ORDER — DIPHENHYDRAMINE HCL 25 MG PO CAPS: 25.0000 mg | ORAL_CAPSULE | ORAL | Status: DC | PRN

## 2017-01-15 MED ORDER — OXYTOCIN 40 UNITS IN LACTATED RINGERS INFUSION - SIMPLE MED: 2.5000 [IU]/h | INTRAVENOUS | Status: AC

## 2017-01-15 MED ORDER — LACTATED RINGERS IV SOLN
INTRAVENOUS | Status: DC | PRN
  Administered 2017-01-15: via INTRAVENOUS

## 2017-01-15 MED ORDER — FENTANYL CITRATE (PF) 100 MCG/2ML IJ SOLN
INTRAMUSCULAR | Status: DC | PRN
Start: 2017-01-15 — End: 2017-01-15
  Administered 2017-01-15: 15 ug via INTRATHECAL
  Administered 2017-01-15: 50 ug via INTRAVENOUS

## 2017-01-15 MED ORDER — DIBUCAINE 1 % RE OINT: 1.0000 "application " | TOPICAL_OINTMENT | RECTAL | Status: DC | PRN

## 2017-01-15 MED ORDER — MENTHOL 3 MG MT LOZG
1.0000 | LOZENGE | OROMUCOSAL | Status: DC | PRN
  Filled 2017-01-15: qty 9

## 2017-01-15 MED ORDER — FENTANYL CITRATE (PF) 100 MCG/2ML IJ SOLN
INTRAMUSCULAR | Status: AC
  Administered 2017-01-15: 50 ug via INTRAVENOUS
  Filled 2017-01-15: qty 2

## 2017-01-15 MED ORDER — OXYCODONE HCL 5 MG PO TABS: 10.0000 mg | ORAL_TABLET | ORAL | Status: DC | PRN

## 2017-01-15 MED ORDER — SIMETHICONE 80 MG PO CHEW
160.0000 mg | CHEWABLE_TABLET | Freq: Four times a day (QID) | ORAL | Status: DC | PRN
  Filled 2017-01-15: qty 2

## 2017-01-15 MED ORDER — MIDAZOLAM HCL 2 MG/2ML IJ SOLN
INTRAMUSCULAR | Status: DC | PRN
  Administered 2017-01-15 (×2): 1 mg via INTRAVENOUS

## 2017-01-15 MED ORDER — PHENYLEPHRINE 40 MCG/ML (10ML) SYRINGE FOR IV PUSH (FOR BLOOD PRESSURE SUPPORT)
PREFILLED_SYRINGE | INTRAVENOUS | Status: DC | PRN
  Administered 2017-01-15 (×2): 100 ug via INTRAVENOUS

## 2017-01-15 NOTE — Op Note (Signed)
Cesarean Section Procedure Note  01/14/2017 - 01/15/2017   Patient:  Heather Blackwell  26 y.o. female at [redacted]w[redacted]d.  Patient's last menstrual period was 04/04/2016. Preoperative diagnosis:  fetal intolerance to labor Postoperative diagnosis:  fetal intolerance to labor, live born female  PROCEDURE:  Procedure(s): CESAREAN SECTION (N/A) Surgeon:  Surgeon(s) and Role:    * Rhya Shan C Tinesha Siegrist, MD - Primary Anesthesia:  Spinal + epidural I/O: Total I/O In: 1100 [I.V.:1100] Out: 1500 [Urine:500; Blood:1000] Specimens: placenta Complications: None Apparent Disposition:  VS stable to PACU  Findings: large globulated uterus, unable to be exteriorized.  Nuchal cord x1 Live born female  Birth Weight: 8 lb 14.2 oz (4030 g) APGAR: 8, 9   Indication for procedure: 25 y.o. female at [redacted]w[redacted]d who presented with ROM, was augmented with pitocin but fetus did not tolerate with recurrent late and variable decelerations.  IUPC showed inadequate contraction strength without pitocin, and no cervical change in 2 hours without pitocin.  Procedure Details   The risks, benefits, complications, treatment options, and expected outcomes were discussed with the patient. Informed consent was obtained. The patient was taken to Operating Room, identified as Almedia Cordell and the procedure verified as a cesarean delivery.   After administration of anesthesia, the patient was prepped and draped in the usual sterile manner, including a vaginal prep. A surgical time out was performed, with the pediatric team present. After confirming adequate anesthesia, a Pfannenstiel incision was made and carried down through the subcutaneous tissue to the fascia. Fascial incision was made and extended transversely. The fascia was separated from the underlying rectus tissue superiorly and inferiorly. The peritoneum was identified and entered. Peritoneal incision was extended longitudinally.  A low transverse uterine incision was made. Delivered from  cephalic presentation was a live born female, "Aerith" . Baby was sluggish upon delivery, thus delayed cord clamping was not performed. The umbilical cord was doubly clamped and cut, and the baby was handed off to the awaitng pediatrician.  The placenta was removed intact and appeared normal. The uterus was to big to be delivered from the abdominal cavity, thus it was left insitu as it was cleared of clots, membranes, and debris. The uterine incision was closed with running locking sutures of 0 Vicryl, and then a second, imbricating stitch was placed. There was still bleeding from the midline and a figure of eight was placed.  Hemostasis was observed. The abdominal cavity was evacuated of extraneous fluid.  The paracolic gutters were cleaned. The fascia was then reapproximated with running suture of vicryl. 60cc of Long- and short-acting bupivicaine was injected circumferentially into the fascia.  After a change of gloves, the subcutaneous tissue was irrigated and reapproximated with 3-0 vicryl. The skin was closed with absorbable sutures, and 40cc of long- and short-acting bupivacaine injected into the skin and subcutaneous tissues.  The incision was covered with surgical glue.     Instrument, sponge, and needle counts were correct prior the abdominal closure and at the conclusion of the case.   I was present and performed this procedure in its entirety.  ----- Ranae Plumber, MD Attending Obstetrician and Gynecologist [facility], Department of OB/GYN [facility]

## 2017-01-15 NOTE — Anesthesia Postprocedure Evaluation (Signed)
Anesthesia Post Note  Patient: Heather Blackwell  Procedure(s) Performed: Procedure(s) (LRB): CESAREAN SECTION (N/A)  Patient location during evaluation: Mother Baby Anesthesia Type: Spinal Level of consciousness: awake, awake and alert and oriented Pain management: pain level controlled Vital Signs Assessment: vitals unstable and post-procedure vital signs reviewed and stable Respiratory status: spontaneous breathing, nonlabored ventilation and respiratory function stable Cardiovascular status: blood pressure returned to baseline and stable Postop Assessment: no headache and no backache Anesthetic complications: no     Last Vitals:  Vitals:   01/15/17 0336 01/15/17 0414  BP:  118/65  Pulse: 96 86  Resp: 13 16  Temp:  37.3 C    Last Pain:  Vitals:   01/15/17 0415  TempSrc:   PainSc: 2                  Ginger Carne

## 2017-01-15 NOTE — Anesthesia Procedure Notes (Addendum)
Epidural Patient location during procedure: OB Start time: 01/15/2017 12:10 AM End time: 01/15/2017 12:27 AM  Staffing Anesthesiologist: Margorie John K Performed: anesthesiologist   Preanesthetic Checklist Completed: patient identified, site marked, surgical consent, pre-op evaluation, timeout performed, IV checked, risks and benefits discussed and monitors and equipment checked  Epidural Patient position: sitting Prep: Betadine Patient monitoring: heart rate, continuous pulse ox and blood pressure Approach: midline Location: L3-L4 Injection technique: LOR saline  Needle:  Needle type: Tuohy  Needle gauge: 18 G Needle length: 9 cm and 9 Needle insertion depth: 7 cm Catheter type: closed end flexible Catheter size: 20 Guage Catheter at skin depth: 10 cm Test dose: negative and 1.5% lidocaine with Epi 1:200 K  Assessment Sensory level: T10 Events: blood not aspirated, injection not painful, no injection resistance, negative IV test and no paresthesia  Additional Notes Patient had trouble holding position even with direction and prompting. CSE placed after multiple spinal attempts. Paresthesia with epidural needle that resolved with retraction and redirection. Patient unable to hold still during procedure. Spinal placed through epidural needle and then catheter placed. No level to ice after spinal so epidural catheter utilized.  Patient tolerated the insertion well without complications.  Reason for block:procedure for pain

## 2017-01-15 NOTE — Anesthesia Post-op Follow-up Note (Signed)
  Anesthesia Pain Follow-up Note  Patient: Heather Blackwell  Day #: 1  Date of Follow-up: 01/15/2017 Time: 7:25 AM  Last Vitals:  Vitals:   01/15/17 0336 01/15/17 0414  BP:  118/65  Pulse: 96 86  Resp: 13 16  Temp:  37.3 C    Level of Consciousness: alert  Pain: none   Side Effects:None  Catheter Site Exam:clean, dry     Plan: D/C from anesthesia care at surgeon's request  [facility]

## 2017-01-15 NOTE — Anesthesia Post-op Follow-up Note (Cosign Needed)
Anesthesia QCDR form completed.        

## 2017-01-15 NOTE — Discharge Instructions (Signed)

## 2017-01-15 NOTE — Transfer of Care (Signed)
Immediate Anesthesia Transfer of Care Note  Patient: Heather Blackwell  Procedure(s) Performed: Procedure(s): CESAREAN SECTION (N/A)  Patient Location: PACU  Anesthesia Type:Spinal and Epidural  Level of Consciousness: awake, alert , oriented and patient cooperative  Airway & Oxygen Therapy: Patient Spontanous Breathing and Patient connected to nasal cannula oxygen  Post-op Assessment: Report given to RN and Post -op Vital signs reviewed and stable  Post vital signs: Reviewed and stable  Last Vitals:  Vitals:   01/14/17 2132 01/14/17 2208  BP: 114/72 121/71  Pulse: 60 71  Resp:    Temp: 36.7 C     Last Pain:  Vitals:   01/14/17 2200  TempSrc:   PainSc: 4          Complications: No apparent anesthesia complications

## 2017-01-15 NOTE — Lactation Note (Signed)
This note was copied from a baby's chart. Lactation Consultation Note  Patient Name: Heather Blackwell [id number] Date: 01/15/2017 Reason for consult: Follow-up assessment   Maternal Data    Feeding    LATCH Score/Interventions                      Lactation Tools Discussed/Used Pump Review: Milk Storage;Setup, frequency, and cleaning Initiated by::  [name], MA, IBCLC, CLC) Date initiated:: 01/15/17   Consult Status Consult Status: Follow-up Baby has attempted multiple times to breastfeed, but is very spitty. Mom started pumping to stimulate and start milk supply.    Burnadette Peter 01/15/2017, 4:00 PM

## 2017-01-15 NOTE — Discharge Summary (Signed)
Obstetrical Discharge Summary  Patient Name: Heather Blackwell DOB: August 02, 1991 MRN: [id number]  Date of Admission: 01/14/2017 Date of Delivery: 01/15/17 Delivered by: Ranae Plumber, MD Date of Discharge: 01/17/2017  Primary OB:  [facility]  NWG:[id number] last menstrual period was 04/04/2016  [facility] Estimated Date of Delivery: 01/07/17 Gestational Age at Delivery: [redacted]w[redacted]d   Antepartum complications:  1.  UTI in pregnancy 2. False positive syphilis serology 3. Anemia  4. Obesity, BMI 33 pre-pregnancy 5. PCN, GELATIN and LATEX allergic 6. Had isolated elevated BPs but no diagnosis of GHTN 7. Occasional missed appointments, did not present for ordered NST on 01/13/17. 8. Anxiety  Admitting Diagnosis: ROM  Secondary Diagnosis: Patient Active Problem List   Diagnosis Date Noted  . UTI (urinary tract infection) during pregnancy 01/15/2017  . Obesity affecting pregnancy 01/15/2017  . Gestational hypertension 01/15/2017  . Supervision of high risk pregnancy in third trimester 01/15/2017  . Indication for care in labor or delivery 01/14/2017  . NST (non-stress test) reactive 12/21/2016    Augmentation: Pitocin Complications: None Intrapartum complications/course: Patient presented with ROM for clear fluid, is s/p pitocin augmentation, with recurrent lates and  variables while on pitocin, requiring multiple discontinuations.  She had FSE and IUPC with amnioinfusion.  No cervical change after 2hrs since pitocin discontinued, contractions, <100 MV units. Decision for cesarean due to fetal intolerance of labor.  Cesarean was routine, 1000cc blood loss.  Date of Delivery: 01/15/17 Delivered By: Leeroy Bock Ward Delivery Type: primary cesarean section, low transverse incision Anesthesia: spinal/epidural combined Placenta: expressed Laceration: n/a Episiotomy: n/a Newborn Data: Live born female "Aerith" Birth Weight:  8lb 14oz 4030g APGAR: 8, 9     Postpartum Procedures: none  Post partum course:   Patient had an uncomplicated postpartum course.  By time of discharge on POD#3, her pain was controlled on oral pain medications; she had appropriate lochia and was ambulating, voiding without difficulty, tolerating regular diet and passing flatus.   She was deemed stable for discharge to home.    Discharge Physical Exam:  BP 121/78 (BP Location: Right Arm)   Pulse 80   Temp 98.1 F (36.7 C) (Oral)   Resp 20   Ht  (1.702 m)   Wt 235 lb (106.6 kg)   LMP 04/04/2016 (LMP Unknown)   SpO2 100%   Breastfeeding? Unknown   BMI 36.81 kg/m   General: NAD CV: RRR Pulm: CTABL, nl effort ABD: s/nd/nt, fundus firm and below the umbilicus Lochia: moderate Incision: c/d/I, covered in surgical glue DVT Evaluation: LE non-ttp, no evidence of DVT on exam.  Hemoglobin  Date Value Ref Range Status  01/15/2017 10.4 (L) 12.0 - 16.0 g/dL Final   HCT  Date Value Ref Range Status  01/15/2017 31.6 (L) 35.0 - 47.0 % Final     Disposition: stable, discharge to home. Baby Feeding: breastmilk and formula Baby Disposition: home with mom  Rh Immune globulin given: n/a Rubella vaccine given: n/a Tdap vaccine given in AP or PP setting: AP Flu vaccine given in AP or PP setting: n/a  Contraception: TBD  Prenatal Labs:   Blood type/Rh A+  Antibody screen neg  Rubella Immune  Varicella Immune  RPR NR  HBsAg Neg  HIV NR  GC neg  Chlamydia neg  Genetic screening Not done  1 hour GTT 99, 103  3 hour GTT --  GBS Negative  12/22/16     Plan:  Heather Blackwell was discharged to home in good condition. Follow-up appointment at [facility] in  2 weeks for incision check, 6 weeks at [facility] for post partum follow up and contraception discussion.   Discharge Medications: Allergies as of 01/17/2017      Reactions   Gelatin Anaphylaxis   Pt states her throat closes   Latex Itching, Swelling   Penicillins Rash      Medication List    TAKE these medications   ascorbic acid 250 MG  tablet Commonly known as:  VITAMIN C Take 1 tablet (250 mg total) by mouth 2 (two) times daily with a meal. Take with iron for anemia   ferrous sulfate 325 (65 FE) MG tablet Take 1 tablet (325 mg total) by mouth 2 (two) times daily with a meal. For anemia, take with Vitamin C   ibuprofen 800 MG tablet Commonly known as:  ADVIL,MOTRIN Take 1 tablet (800 mg total) by mouth every 8 (eight) hours as needed for moderate pain or cramping.   multivitamin-prenatal 27-0.8 MG Tabs tablet Take 1 tablet by mouth daily at 12 noon.   ondansetron 4 MG disintegrating tablet Commonly known as:  ZOFRAN ODT Take 1 tablet (4 mg total) by mouth every 8 (eight) hours as needed for nausea or vomiting.   oxyCODONE-acetaminophen 5-325 MG tablet Commonly known as:  PERCOCET Take 1-2 tablets by mouth every 6 (six) hours as needed for severe pain.       Follow-up Information    Elenora Fender Ward, MD Follow up in 2 week(s).   Specialty:  Obstetrics and Gynecology Why:  for incision check Contact information: [address] [facility] [place] [address] [phone]        [facility] Department Follow up in 6 week(s).   Why:  for routine post partum check Contact information: [address] [place] [address] [phone]           Signed: Christeen Douglas 01/17/17

## 2017-01-16 LAB — SURGICAL PATHOLOGY

## 2017-01-16 MED ORDER — BREAST MILK
ORAL | Status: DC
  Filled 2017-01-16 (×25): qty 1

## 2017-01-16 NOTE — Lactation Note (Signed)
This note was copied from a baby's chart. Lactation Consultation Note  Patient Name: Girl Jamelyn Bovard ZOXWR'[id number] Date: 01/16/2017 Reason for consult: Follow-up assessment   Maternal Data    Feeding Feeding Type: Breast Fed Length of feed: 15 min  LATCH Score/Interventions Latch: Repeated attempts needed to sustain latch, nipple held in mouth throughout feeding, stimulation needed to elicit sucking reflex. Intervention(s): Teach feeding cues Intervention(s): Assist with latch;Adjust position  Audible Swallowing: A few with stimulation Intervention(s): Hand expression Intervention(s): Hand expression  Type of Nipple: Everted at rest and after stimulation  Comfort (Breast/Nipple): Soft / non-tender     Hold (Positioning): Full assist, staff holds infant at breast Intervention(s): Support Pillows;Position options  LATCH Score: 6  Lactation Tools Discussed/Used Tools: Nipple Shields Nipple shield size: 20   Consult Status Consult Status: Follow-up  Mom seems stressed due to clusterfeeding, but says she want to continue with breastfeeding. Baby has lost a 6% in 24hr period, LC suggested using SNS until volume is greater. Mom has a fair amount of colostrum, but swallows are not as frequent as they should be. LC and RN are helping mom come up with a feeding plan.  Burnadette Peter 01/16/2017, 11:35 AM

## 2017-01-16 NOTE — Progress Notes (Signed)
Subjective: Postpartum Day 1: Cesarean Delivery Patient reports no c/o . Some soreness.    Objective: Vital signs in last 24 hours: Temp:  [98 F (36.7 C)-98.6 F (37 C)] 98.3 F (36.8 C) (04/18 0821) Pulse Rate:  [75-93] 75 (04/18 0821) Resp:  [17-20] 20 (04/18 0821) BP: (103-119)/(55-74) 113/63 (04/18 0821) SpO2:  [98 %-99 %] 98 % (04/18 8295)  Physical Exam:  General: alert and cooperative Lochia: appropriate Uterine Fundus: firm Incision: healing well DVT Evaluation: No evidence of DVT seen on physical exam.   Recent Labs  01/14/17 1111 01/15/17 0643  HGB 12.9 10.4*  HCT 37.9 31.6*    Assessment/Plan: Status post Cesarean section. Doing well postoperatively.  Continue current care.  Heather Blackwell 01/16/2017, 9:17 AM

## 2017-01-17 MED ORDER — OXYCODONE-ACETAMINOPHEN 5-325 MG PO TABS: 1.0000 | ORAL_TABLET | Freq: Four times a day (QID) | ORAL | 0 refills | Status: AC | PRN

## 2017-01-17 MED ORDER — FERROUS SULFATE 325 (65 FE) MG PO TABS: 325.0000 mg | ORAL_TABLET | Freq: Two times a day (BID) | ORAL | 3 refills | Status: AC

## 2017-01-17 MED ORDER — ASCORBIC ACID 250 MG PO TABS: 250.0000 mg | ORAL_TABLET | Freq: Two times a day (BID) | ORAL | 3 refills | Status: AC

## 2017-01-17 MED ORDER — ONDANSETRON 4 MG PO TBDP: 4.0000 mg | ORAL_TABLET | Freq: Three times a day (TID) | ORAL | 0 refills | Status: AC | PRN

## 2017-01-17 MED ORDER — IBUPROFEN 800 MG PO TABS: 800.0000 mg | ORAL_TABLET | Freq: Three times a day (TID) | ORAL | 0 refills | Status: AC | PRN

## 2017-01-17 NOTE — Progress Notes (Signed)
Patient discharged home with infant and significant other. Discharge instructions, prescriptions and follow up appointment given to and reviewed with patient and significant other. Patient verbalized understanding. Escorted out via wheelchair by Staff.

## 2017-05-20 ENCOUNTER — Other Ambulatory Visit: Payer: Self-pay | Admitting: Nurse Practitioner

## 2017-05-20 DIAGNOSIS — Z369 Encounter for antenatal screening, unspecified: Secondary | ICD-10-CM

## 2017-06-10 ENCOUNTER — Other Ambulatory Visit: Payer: Self-pay | Admitting: Nurse Practitioner

## 2017-06-10 ENCOUNTER — Ambulatory Visit
Admission: RE | Admit: 2017-06-10 | Discharge: 2017-06-10 | Disposition: A | Discharge status: Home / Self Care | Payer: Medicaid Other | Source: Ambulatory Visit | Attending: Nurse Practitioner | Admitting: Nurse Practitioner

## 2017-06-10 ENCOUNTER — Ambulatory Visit (HOSPITAL_BASED_OUTPATIENT_CLINIC_OR_DEPARTMENT_OTHER)
Admission: RE | Admit: 2017-06-10 | Discharge: 2017-06-10 | Disposition: A | Discharge status: Home / Self Care | Payer: Medicaid Other | Source: Ambulatory Visit | Attending: Obstetrics and Gynecology | Admitting: Obstetrics and Gynecology

## 2017-06-10 VITALS — BP 108/66 | HR 82 | Temp 97.8°F | Resp 17 | Ht 67.0 in | Wt 199.8 lb

## 2017-06-10 DIAGNOSIS — Z369 Encounter for antenatal screening, unspecified: Secondary | ICD-10-CM

## 2017-06-10 DIAGNOSIS — Z363 Encounter for antenatal screening for malformations: Secondary | ICD-10-CM

## 2017-06-10 DIAGNOSIS — Z3A14 14 weeks gestation of pregnancy: Secondary | ICD-10-CM | POA: Diagnosis not present

## 2017-06-10 NOTE — Progress Notes (Addendum)
Referring physician:  [facility] Department Length of Consultation: 40 minutes   Ms. Schrum  was referred to [facility] [place] for genetic counseling to review prenatal screening and testing options.  This note summarizes the information we discussed.    We offered the following routine screening tests for this pregnancy:  First trimester screening, which includes nuchal translucency ultrasound screen and first trimester maternal serum marker screening.  The nuchal translucency has approximately an 80% detection rate for Down syndrome and can be positive for other chromosome abnormalities as well as congenital heart defects.  When combined with a maternal serum marker screening, the detection rate is up to 90% for Down syndrome and up to 97% for trisomy 18.     Maternal serum marker screening, a blood test that measures pregnancy proteins, can provide risk assessments for Down syndrome, trisomy 18, and open neural tube defects (spina bifida, anencephaly). Because it does not directly examine the fetus, it cannot positively diagnose or rule out these problems.  Targeted ultrasound uses high frequency sound waves to create an image of the developing fetus.  An ultrasound is often recommended as a routine means of evaluating the pregnancy.  It is also used to screen for fetal anatomy problems (for example, a heart defect) that might be suggestive of a chromosomal or other abnormality.   Should these screening tests indicate an increased concern, then the following additional testing options would be offered:  The chorionic villus sampling procedure is available for first trimester chromosome analysis.  This involves the withdrawal of a small amount of chorionic villi (tissue from the developing placenta).  Risk of pregnancy loss is estimated to be approximately 1 in 200 to 1 in 100 (0.5 to 1%).  There is approximately a 1% (1 in 100) chance that the CVS chromosome  results will be unclear.  Chorionic villi cannot be tested for neural tube defects.     Amniocentesis involves the removal of a small amount of amniotic fluid from the sac surrounding the fetus with the use of a thin needle inserted through the maternal abdomen and uterus.  Ultrasound guidance is used throughout the procedure.  Fetal cells from amniotic fluid are directly evaluated and > 99.5% of chromosome problems and > 98% of open neural tube defects can be detected. This procedure is generally performed after the 15th week of pregnancy.  The main risks to this procedure include complications leading to miscarriage in less than 1 in 200 cases (0.5%).  As another option for information if the pregnancy is suspected to be an an increased chance for certain chromosome conditions, we also reviewed the availability of cell free fetal DNA testing from maternal blood to determine whether or not the baby may have either Down syndrome, trisomy 65, or trisomy 73.  This test utilizes a maternal blood sample and DNA sequencing technology to isolate circulating cell free fetal DNA from maternal plasma.  The fetal DNA can then be analyzed for DNA sequences that are derived from the three most common chromosomes involved in aneuploidy, chromosomes 13, 18, and 21.  If the overall amount of DNA is greater than the expected level for any of these chromosomes, aneuploidy is suspected.  While we do not consider it a replacement for invasive testing and karyotype analysis, a negative result from this testing would be reassuring, though not a guarantee of a normal chromosome complement for the baby.  An abnormal result is certainly suggestive of an abnormal chromosome  complement, though we would still recommend CVS or amniocentesis to confirm any findings from this testing.  Cystic Fibrosis and Spinal Muscular Atrophy (SMA) screening were also discussed with the patient. Both conditions are recessive, which means that both  parents must be carriers in order to have a child with the disease.  Cystic fibrosis (CF) is one of the most common genetic conditions in persons of Caucasian ancestry.  This condition occurs in approximately 1 in 2,500 Caucasian persons and results in thickened secretions in the lungs, digestive, and reproductive systems.  For a baby to be at risk for having CF, both of the parents must be carriers for this condition.  Approximately 1 in 5825 Caucasian persons is a carrier for CF.  Current carrier testing looks for the most common mutations in the gene for CF and can detect approximately 90% of carriers in the Caucasian population.  This means that the carrier screening can greatly reduce, but cannot eliminate, the chance for an individual to have a child with CF.  If an individual is found to be a carrier for CF, then carrier testing would be available for the partner. As part of [place]'s newborn screening profile, all babies born in the state of [place] will have a two-tier screening process.  Specimens are first tested to determine the concentration of immunoreactive trypsinogen (IRT).  The top 5% of specimens with the highest IRT values then undergo DNA testing using a panel of over 40 common CF mutations. SMA is a neurodegenerative disorder that leads to atrophy of skeletal muscle and overall weakness.  This condition is also more prevalent in the Caucasian population, with 1 in 40-1 in 60 persons being a carrier and 1 in 6,000-1 in 10,000 children being affected.  There are multiple forms of the disease, with some causing death in infancy to other forms with survival into adulthood.  The genetics of SMA is complex, but carrier screening can detect up to 95% of carriers in the Caucasian population.  Similar to CF, a negative result can greatly reduce, but cannot eliminate, the chance to have a child with SMA.  This is Heather Blackwell' third pregnancy with her current partner, Eliberto Ivoryustin. She has a  5-and-a-half-month old baby girl who is healthy. She has had one pregnancy that ended in a spontaneous abortion early in pregnancy.  Heather Blackwell reports that both her and her partner, Eliberto Ivoryustin, have needed an IEP in school. She received her GED and has been diagnosed with Asperger's. The patient has two half siblings, one of which is reported to have an enlarged "club foot" that required surgery and a brace, etiology is unclear. She reports another half-brother has been diagnosed with autism. She reports that this brother is very tall, about 7 ft, has pectus excavatum, needs glasses, has had many dental issues, is hypermobile, and has very long fingers. He does not look like others in the family. We discussed that those features may be associated with a disorders of connective tissue such as Marfan syndrome. We recommend that her younger brother see his primary care physician to discuss his physical features and coordinate appropriate workup and referrals if necessary.  We are happy to facilitate a referral to genetics if desired.  Heather Blackwell also reports that 3 individuals on her maternal side of the family have had breast cancer, the closest being a maternal grandmother who had cancer diagnosed in her 8350's. We discussed that there can be changes to genes that make an individual  more susceptible to breast cancer, and if multiple family members in the family have had a diagnosis of breast cancer, particularly prior to menopause, then this may raise some concerns. We encouraged her to discuss this family history of breast cancer with her doctor to make proper referrals if warranted.  Eliberto Ivory has two half-siblings, both healthy. His mother was in attendance, and reports that she has COPD and a history of smoking. Eliberto Ivory also has an uncle diagnosed with MS and an uncle with diabetes mellitus.  There was no known family history of neuromuscular conditions, individuals born deaf or blind, bleeding conditions or other genetic  conditions. She denies consanguinity with her partner.  Both are of Caucasian ancestry.  Given the family history of learning differences and Asperger in the patient as well as autism in her brother, we discussed various causes for intellectual differences, some of which are inherited and others that are not.  We offered the option of carrier screening for Fragile X syndrome due to this history, which the patient declined.    Ms. Crigger reported no complications or exposures in this pregnancy that would be expected to increase the risk for birth defects.    After consideration of the options, Ms. Vinas elected to proceed with an ultrasound only.  She declined first trimester screening and carrier testing for Fragile X, SMA and CF.  An ultrasound was performed at the time of the visit.  The gestational age was consistent with  14 weeks, 6 days.  Fetal anatomy could not be assessed due to early gestational age.  Please refer to the ultrasound report for details of that study.  Ms. Buonocore was encouraged to call with questions or concerns.  We can be contacted at [phone].    Cherly Anderson, MS, CGC  Katrina Stack, MS, CGC performed an integral service incident to the physician's initial service.  I was physically present in the clinical area and was immediately available to render assistance.   Shanine Kreiger C Eiley Mcginnity

## 2017-07-01 ENCOUNTER — Ambulatory Visit
Admission: RE | Admit: 2017-07-01 | Discharge: 2017-07-01 | Disposition: A | Discharge status: Home / Self Care | Payer: Medicaid Other | Source: Ambulatory Visit | Attending: Obstetrics and Gynecology | Admitting: Obstetrics and Gynecology

## 2017-07-01 VITALS — BP 105/66 | HR 87 | Temp 97.7°F | Resp 18 | Ht 67.0 in | Wt 195.8 lb

## 2017-07-01 DIAGNOSIS — O3482 Maternal care for other abnormalities of pelvic organs, second trimester: Secondary | ICD-10-CM | POA: Insufficient documentation

## 2017-07-01 DIAGNOSIS — E669 Obesity, unspecified: Secondary | ICD-10-CM | POA: Diagnosis present

## 2017-07-01 DIAGNOSIS — O34219 Maternal care for unspecified type scar from previous cesarean delivery: Secondary | ICD-10-CM | POA: Diagnosis not present

## 2017-07-01 DIAGNOSIS — Z363 Encounter for antenatal screening for malformations: Secondary | ICD-10-CM

## 2017-07-01 DIAGNOSIS — Z3A17 17 weeks gestation of pregnancy: Secondary | ICD-10-CM | POA: Insufficient documentation

## 2017-07-01 DIAGNOSIS — N83291 Other ovarian cyst, right side: Secondary | ICD-10-CM | POA: Diagnosis not present

## 2017-07-01 DIAGNOSIS — O99212 Obesity complicating pregnancy, second trimester: Secondary | ICD-10-CM | POA: Diagnosis not present

## 2017-07-15 ENCOUNTER — Ambulatory Visit
Admission: RE | Admit: 2017-07-15 | Discharge: 2017-07-15 | Disposition: A | Discharge status: Home / Self Care | Payer: Medicaid Other | Source: Ambulatory Visit | Attending: Obstetrics and Gynecology | Admitting: Obstetrics and Gynecology

## 2017-07-15 DIAGNOSIS — Z3A19 19 weeks gestation of pregnancy: Secondary | ICD-10-CM | POA: Diagnosis not present

## 2017-07-15 DIAGNOSIS — O99212 Obesity complicating pregnancy, second trimester: Secondary | ICD-10-CM | POA: Diagnosis present

## 2017-07-23 ENCOUNTER — Inpatient Hospital Stay
Admission: EM | Admit: 2017-07-23 | Discharge: 2017-07-24 | Disposition: A | Discharge status: Home / Self Care | Payer: Medicaid Other | Attending: Obstetrics and Gynecology | Admitting: Obstetrics and Gynecology

## 2017-07-23 DIAGNOSIS — O132 Gestational [pregnancy-induced] hypertension without significant proteinuria, second trimester: Secondary | ICD-10-CM | POA: Insufficient documentation

## 2017-07-23 DIAGNOSIS — Z3A21 21 weeks gestation of pregnancy: Secondary | ICD-10-CM | POA: Insufficient documentation

## 2017-07-23 DIAGNOSIS — S3981XA Other specified injuries of abdomen, initial encounter: Secondary | ICD-10-CM | POA: Insufficient documentation

## 2017-07-23 DIAGNOSIS — Z3492 Encounter for supervision of normal pregnancy, unspecified, second trimester: Secondary | ICD-10-CM

## 2017-07-23 DIAGNOSIS — O26892 Other specified pregnancy related conditions, second trimester: Secondary | ICD-10-CM | POA: Insufficient documentation

## 2017-07-23 DIAGNOSIS — F329 Major depressive disorder, single episode, unspecified: Secondary | ICD-10-CM | POA: Insufficient documentation

## 2017-07-23 DIAGNOSIS — O2342 Unspecified infection of urinary tract in pregnancy, second trimester: Secondary | ICD-10-CM | POA: Insufficient documentation

## 2017-07-23 DIAGNOSIS — X58XXXA Exposure to other specified factors, initial encounter: Secondary | ICD-10-CM | POA: Insufficient documentation

## 2017-07-23 DIAGNOSIS — O99342 Other mental disorders complicating pregnancy, second trimester: Secondary | ICD-10-CM | POA: Insufficient documentation

## 2017-07-23 DIAGNOSIS — O99212 Obesity complicating pregnancy, second trimester: Secondary | ICD-10-CM | POA: Insufficient documentation

## 2017-07-23 DIAGNOSIS — F419 Anxiety disorder, unspecified: Secondary | ICD-10-CM | POA: Insufficient documentation

## 2017-07-24 ENCOUNTER — Encounter: Payer: Self-pay | Admitting: *Deleted

## 2017-07-24 DIAGNOSIS — O26892 Other specified pregnancy related conditions, second trimester: Secondary | ICD-10-CM | POA: Diagnosis not present

## 2017-07-24 DIAGNOSIS — F419 Anxiety disorder, unspecified: Secondary | ICD-10-CM | POA: Diagnosis not present

## 2017-07-24 DIAGNOSIS — O99342 Other mental disorders complicating pregnancy, second trimester: Secondary | ICD-10-CM | POA: Diagnosis not present

## 2017-07-24 DIAGNOSIS — S3981XA Other specified injuries of abdomen, initial encounter: Secondary | ICD-10-CM | POA: Diagnosis present

## 2017-07-24 DIAGNOSIS — X58XXXA Exposure to other specified factors, initial encounter: Secondary | ICD-10-CM | POA: Diagnosis not present

## 2017-07-24 DIAGNOSIS — F329 Major depressive disorder, single episode, unspecified: Secondary | ICD-10-CM | POA: Diagnosis not present

## 2017-07-24 DIAGNOSIS — O2342 Unspecified infection of urinary tract in pregnancy, second trimester: Secondary | ICD-10-CM | POA: Diagnosis not present

## 2017-07-24 DIAGNOSIS — Z3A21 21 weeks gestation of pregnancy: Secondary | ICD-10-CM | POA: Diagnosis not present

## 2017-07-24 DIAGNOSIS — O99212 Obesity complicating pregnancy, second trimester: Secondary | ICD-10-CM | POA: Diagnosis not present

## 2017-07-24 DIAGNOSIS — O132 Gestational [pregnancy-induced] hypertension without significant proteinuria, second trimester: Secondary | ICD-10-CM | POA: Diagnosis not present

## 2017-07-24 MED ORDER — ACETAMINOPHEN 325 MG PO TABS: 650.0000 mg | ORAL_TABLET | ORAL | Status: DC | PRN

## 2017-07-24 MED ORDER — CALCIUM CARBONATE ANTACID 500 MG PO CHEW: 2.0000 | CHEWABLE_TABLET | ORAL | Status: DC | PRN

## 2017-07-24 MED ORDER — PRENATAL MULTIVITAMIN CH: 1.0000 | ORAL_TABLET | Freq: Every day | ORAL | Status: DC

## 2017-07-24 MED ORDER — ZOLPIDEM TARTRATE 5 MG PO TABS: 5.0000 mg | ORAL_TABLET | Freq: Every evening | ORAL | Status: DC | PRN

## 2017-07-24 NOTE — OB Triage Note (Signed)
Recvd pt from ED. Pt states she was at work and bumped her belly into the air fryer and started cramping slightly after. Pt reports cramping is much less but just wanted to make sure everything was ok. States positive fetal movement and no LOF or vaginal bleeding. Also says she has not had a lot to drink today. Cannot rate the cramping on the pain scale, just says she notices that it's there at times.

## 2017-07-24 NOTE — Discharge Instructions (Signed)
Come back if cramping worsens or gets closer together

## 2017-07-24 NOTE — Final Progress Note (Signed)
TRIAGE NOTE to rule out Preterm Labor   History of Present Illness: Heather Blackwell is a 26 y.o. G3P1011 at 6119w1d presenting to triage after mild abdominal trauma at work. Hx of material insufficiency, currently safe at home.  No current cramping. NO LOF, vaginal bleeding.   Doptones +fetla heart tones +145bpm.  Hx of c/s in january  Patient Active Problem List   Diagnosis Date Noted  . Encounter for antenatal screening for malformation using ultrasound   . UTI (urinary tract infection) during pregnancy 01/15/2017  . Obesity affecting pregnancy 01/15/2017  . Gestational hypertension 01/15/2017  . Supervision of high risk pregnancy in third trimester 01/15/2017  . Indication for care in labor or delivery 01/14/2017  . NST (non-stress test) reactive 12/21/2016    Past Medical History:  Diagnosis Date  . Anemia   . Anxiety   . Depression     Past Surgical History:  Procedure Laterality Date  . CESAREAN SECTION N/A 01/14/2017   Procedure: CESAREAN SECTION;  Surgeon: Elenora Fenderhelsea C Ward, MD;  Location: [facility] ORS;  Service: Obstetrics;  Laterality: N/A;  . DENTAL SURGERY      OB History  Gravida Para Term Preterm AB Living  3 1 1  0 1 1  SAB TAB Ectopic Multiple Live Births  1 0 0 0 1    # Outcome Date GA Lbr Len/2nd Weight Sex Delivery Anes PTL Lv  3 Current           2 Term 01/15/17 8316w1d  4.03 kg (8 lb 14.2 oz) F CS-LTranv Spinal, EPI  LIV  1 SAB               Social History   Social History  . Marital status: Single    Spouse name: N/A  . Number of children: N/A  . Years of education: N/A   Social History Main Topics  . Smoking status: Never Smoker  . Smokeless tobacco: Never Used  . Alcohol use No  . Drug use: No  . Sexual activity: Yes    Birth control/ protection: Pill   Other Topics Concern  . None   Social History Narrative  . None    History reviewed. No pertinent family history.  Allergies  Allergen Reactions  . Gelatin Anaphylaxis    Pt states  her throat closes  . Latex Itching and Swelling  . Penicillins Rash    Prescriptions Prior to Admission  Medication Sig Dispense Refill Last Dose  . ferrous sulfate 325 (65 FE) MG EC tablet Take 325 mg by mouth 3 (three) times daily with meals.   07/24/2017 at Unknown time  . ferrous sulfate 325 (65 FE) MG tablet Take 1 tablet (325 mg total) by mouth 2 (two) times daily with a meal. For anemia, take with Vitamin C 60 tablet 3   . ondansetron (ZOFRAN ODT) 4 MG disintegrating tablet Take 1 tablet (4 mg total) by mouth every 8 (eight) hours as needed for nausea or vomiting. (Patient not taking: Reported on 07/24/2017) 20 tablet 0 Not Taking at Unknown time  . oxyCODONE-acetaminophen (PERCOCET) 5-325 MG tablet Take 1-2 tablets by mouth every 6 (six) hours as needed for severe pain. (Patient not taking: Reported on 07/15/2017) 15 tablet 0 Not Taking at Unknown time  . Prenatal Vit-Fe Fumarate-FA (MULTIVITAMIN-PRENATAL) 27-0.8 MG TABS tablet Take 1 tablet by mouth daily at 12 noon.   Not Taking at Unknown time    Review of Systems - See HPI for OB specific ROS.  Vitals:  BP 115/69 (BP Location: Right Arm)   Pulse 84   Temp 98 F (36.7 C) (Oral)   Resp 16   LMP 03/03/2017 (Approximate)  Physical Examination: CONSTITUTIONAL: Well-developed, well-nourished female in no acute distress.  HENT:  Normocephalic, atraumatic EYES: Conjunctivae and EOM are normal. No scleral icterus.  NECK: Normal range of motion, supple, SKIN: Skin is warm and dry. No rash noted. Not diaphoretic. No erythema. No pallor. NEUROLGIC: Alert and oriented to person, place, and time. No gross cranial nerve deficit noted. PSYCHIATRIC: Normal mood and affect. Normal behavior. Normal judgment and thought content. ABDOMEN: Soft, nontender, nondistended, gravid.  Cervix: deferred Membranes:{intact Fetal Monitoring: doppler 145 Tocometer: n/a  Labs:  No results found for this or any previous visit (from the past 24  hour(s)).  Imaging Studies: [place] Mfm Ob Comp + 14 Wk  Result Date: 07/01/2017 ----------------------------------------------------------------------  OBSTETRICS REPORT                      (Signed Final 07/01/2017 10:26 am) ---------------------------------------------------------------------- PATIENT INFO:  ID #:       [id number]                          D.O.B.:  [birth year] (25 yrs)  Name:       [name]                   Visit Date: 07/01/2017 09:00 am ---------------------------------------------------------------------- PERFORMED BY:  Performed By:     Daisy Blossom,        Ref. Address:     [address]                    [address], [address]                                                             [address],                                                             [place], [place]                                                             [address]  Referred By:      Alberteen Spindle CNM ---------------------------------------------------------------------- SERVICE(S) PROVIDED:   [place] MFM OB COMP + 14 WK                               76805.01  ---------------------------------------------------------------------- INDICATIONS:   [redacted] weeks gestation of pregnancy                Z3A.17   Anatomy   Obesity (BMI 30)  Close interval pregnancy   History of c-section (April 2018)  ---------------------------------------------------------------------- FETAL EVALUATION:  Num Of Fetuses:     1  Fetal Heart         153  Rate(bpm):  Presentation:       variable  Placenta:           Fundal, No previa  Amniotic Fluid  AFI FV:      Within normal limits ---------------------------------------------------------------------- BIOMETRY:  BPD:      34.7  mm     G. Age:  16w 5d          8  %    CI:        67.24   %    70 - 86                                                          FL/HC:      18.7   %    15.8 - 18  HC:      135.5  mm     G. Age:  17w 0d          9  %                                                           FL/BPD:     72.9   %  FL:       25.3  mm     G. Age:  17w 5d         38  %  HUM:      23.7  mm     G. Age:  17w 2d         40  %  CER:        16  mm     G. Age:  16w 0d          8  %  CM:        3.7  mm ---------------------------------------------------------------------- GESTATIONAL AGE:  LMP:           17w 1d        Date:  03/03/17                 EDD:   12/08/17  U/S Today:     17w 1d                                        EDD:   12/08/17  Best:          17w 6d     Det. By:  U/S  (06/10/17)          EDD:   12/03/17 ---------------------------------------------------------------------- ANATOMY:  Cavum:                 CSP visualized         Aortic Arch:            Suboptimal  Ventricles:            Normal appearance  Diaphragm:              Within Normal Limits  Cerebellum:            Within Normal Limits   Stomach:                Seen  Posterior Fossa:       Within Normal Limits   Abdomen:                Within Normal                                                                        Limits  Nuchal Fold:           Within Normal Limits   Abdominal Wall:         Normal appearance  Face:                  Orbits visualized      Cord Vessels:           3 vessels  Lips:                  Suboptimal             Kidneys:                Normal appearance  Thoracic:              Within Normal Limits   Bladder:                Seen  Heart:                 Suboptimal             Spine:                  Normal appearance  RVOT:                  Normal appearance      Upper Extremities:      Visualized  LVOT:                  Normal appearance      Lower Extremities:      Visualized  Other:  Profile appears normal; Bicaval and 3 vessel trachea view suboptimal ---------------------------------------------------------------------- CERVIX UTERUS ADNEXA:  Cervix  Length:           3.01  cm.  Right Ovary  Size(cm)       3.5  x   2.5    x  2.7       Vol(ml): 12.4  Simple Cyst = 2.3 x 2 x 2.8 cm  ---------------------------------------------------------------------- IMPRESSION:  Single intrauterine pregnancy with a best estimated  gestational age of [redacted] weeks 6 days.  Dating is based on  earliest available ultrasound performed at [facility] on 06/10/2017; measurements were reporte as 14  weeks 6 days.  Detailed ultrasound performed for the indication of maternal  obesity (BMI 30).  Due to poor maternal acoustics, views of the nose/lip, 4  chamber heart, bicaval view and 3 vessel trachea view of the  heart were inadequately visualized.  Other fetal anatomy  visualized appears  normal.  The right ovary contains a simple cyst measuring 2.3 x 2 x  2.8 cm.  The left ovary was not visualized.  Findings were discussed.  Will schedule follow up ultrasound  to complete the fetal anatomy in 2 weeks. ----------------------------------------------------------------------                  Kirby Funk, MD Electronically Signed Final Report   07/01/2017 10:26 am ----------------------------------------------------------------------  [place] Mfm Ob Follow Up  Result Date: 07/15/2017 ----------------------------------------------------------------------  OBSTETRICS REPORT                      (Signed Final 07/15/2017 09:04 am) ---------------------------------------------------------------------- PATIENT INFO:  ID #:       [id number]                          D.O.B.:  [birth year] (25 yrs)  Name:       [facility]                   Visit Date: 07/15/2017 08:55 am ---------------------------------------------------------------------- PERFORMED BY:  Performed By:     Lenoria Farrier            Ref. Address:     [address],                                                             [place], [place]                                                             [address]  Referred By:      Alberteen Spindle CNM  ---------------------------------------------------------------------- SERVICE(S) PROVIDED:   [place] MFM OB FOLLOW UP                                  [phone]  ---------------------------------------------------------------------- INDICATIONS:   [redacted] weeks gestation of pregnancy                Z3A.19   Anatomy follow up   Obesity (BMI 30)   Prior c-section  ---------------------------------------------------------------------- FETAL EVALUATION:  Num Of Fetuses:     1  Fetal Heart         152  Rate(bpm):  Fetal Lie:          Maternal Right  Presentation:       Cephalic  Placenta:           Fundal Grade 0, No previa ---------------------------------------------------------------------- GESTATIONAL AGE:  LMP:  19w 1d        Date:  03/03/17                 EDD:   12/08/17  Best:          19w 6d     Det. By:  U/S  (06/10/17)          EDD:   12/03/17 ---------------------------------------------------------------------- ANATOMY:  Lips:                  Normal appearance      Heart:                  4-Chamber view                                                                        appears normal  Other:  3 vessel trachea and bicaval views appear normal ---------------------------------------------------------------------- CERVIX UTERUS ADNEXA:  Cervix  Length:              4  cm. ---------------------------------------------------------------------- IMPRESSION:  Single intrauterine pregnancy with a best estimated  gestational age of [redacted] weeks 6 days.  Dating is based on  earliest available ultrasound performed at [facility] on 06/10/2017; measurements were reporte as 14  weeks 6 days.  Follow up exam performed to complete the fetal anatomy.  Four chamber, bicaval view, 3 vessel trachea and nose/lips  appear normal.  Other fetal anatomy has been documented  previously.  Findings were discussed. ----------------------------------------------------------------------                  Kirby Funk, MD  Electronically Signed Final Report   07/15/2017 09:04 am ----------------------------------------------------------------------    Assessment and Plan: Patient Active Problem List   Diagnosis Date Noted  . Encounter for antenatal screening for malformation using ultrasound   . UTI (urinary tract infection) during pregnancy 01/15/2017  . Obesity affecting pregnancy 01/15/2017  . Gestational hypertension 01/15/2017  . Supervision of high risk pregnancy in third trimester 01/15/2017  . Indication for care in labor or delivery 01/14/2017  . NST (non-stress test) reactive 12/21/2016    1. Mild abdominal trauma at work several hours ago in priviable pregnancy 2. No pain or tenderness 3. Normal fetal movement at [redacted]w[redacted]d 4. D/c home with precautions 5. F/u as scheduled  Cline Cools, MD, MPH

## 2017-07-30 ENCOUNTER — Inpatient Hospital Stay
Admission: EM | Admit: 2017-07-30 | Discharge: 2017-07-30 | Disposition: A | Discharge status: Home / Self Care | Payer: Medicaid Other | Attending: Obstetrics and Gynecology | Admitting: Obstetrics and Gynecology

## 2017-07-30 ENCOUNTER — Inpatient Hospital Stay: Payer: Medicaid Other

## 2017-07-30 DIAGNOSIS — O26892 Other specified pregnancy related conditions, second trimester: Secondary | ICD-10-CM | POA: Insufficient documentation

## 2017-07-30 DIAGNOSIS — Z3A22 22 weeks gestation of pregnancy: Secondary | ICD-10-CM | POA: Insufficient documentation

## 2017-07-30 DIAGNOSIS — R102 Pelvic and perineal pain: Secondary | ICD-10-CM | POA: Diagnosis present

## 2017-07-30 DIAGNOSIS — R109 Unspecified abdominal pain: Secondary | ICD-10-CM

## 2017-07-30 MED ORDER — CYCLOBENZAPRINE HCL 10 MG PO TABS: 10.0000 mg | ORAL_TABLET | Freq: Three times a day (TID) | ORAL | 2 refills | Status: AC | PRN

## 2017-07-30 NOTE — OB Triage Note (Signed)
G3/P1, 249w0d complaining of abdominal cramping, intermittent x 1 week since running into machinery at work that increased this morning. Unable to provide a pain rating, appears to be in no acute distress. VS stable. Positive fetal movement. Denies vaginal bleeding. Reports scant thick, white vaginal discharge. Monitors applied and assessing. FHT 150.

## 2017-07-30 NOTE — Final Progress Note (Signed)
TRIAGE NOTE to rule out Preterm Labor   History of Present Illness: Heather Blackwell is a 26 y.o. G3P1011 at [redacted]w[redacted]d presenting to triage for preterm pain.  PT with abdominal trauma at work last week, still having occasional cramping, wanted to confirm baby is ok. Is having tugging at c-section scar, which was from April 2018. No LOF, VB, fundal tenderness. Normal fetal movement.  Patient Active Problem List   Diagnosis Date Noted  . Encounter for antenatal screening for malformation using ultrasound   . UTI (urinary tract infection) during pregnancy 01/15/2017  . Obesity affecting pregnancy 01/15/2017  . Gestational hypertension 01/15/2017  . Supervision of high risk pregnancy in third trimester 01/15/2017  . Indication for care in labor or delivery 01/14/2017  . NST (non-stress test) reactive 12/21/2016    Past Medical History:  Diagnosis Date  . Anemia   . Anxiety   . Depression     Past Surgical History:  Procedure Laterality Date  . CESAREAN SECTION N/A 01/14/2017   Procedure: CESAREAN SECTION;  Surgeon: Elenora Fender Ward, MD;  Location: [facility] ORS;  Service: Obstetrics;  Laterality: N/A;  . DENTAL SURGERY      OB History  Gravida Para Term Preterm AB Living  3 1 1  0 1 1  SAB TAB Ectopic Multiple Live Births  1 0 0 0 1    # Outcome Date GA Lbr Len/2nd Weight Sex Delivery Anes PTL Lv  3 Current           2 Term 01/15/17 [redacted]w[redacted]d  4.03 kg (8 lb 14.2 oz) F CS-LTranv Spinal, EPI  LIV  1 SAB               Social History   Social History  . Marital status: Single    Spouse name: N/A  . Number of children: N/A  . Years of education: N/A   Social History Main Topics  . Smoking status: Never Smoker  . Smokeless tobacco: Never Used  . Alcohol use No  . Drug use: No  . Sexual activity: Yes    Birth control/ protection: Pill   Other Topics Concern  . None   Social History Narrative  . None    History reviewed. No pertinent family history.  Allergies  Allergen  Reactions  . Gelatin Anaphylaxis    Pt states her throat closes  . Latex Itching and Swelling  . Penicillins Rash    Prescriptions Prior to Admission  Medication Sig Dispense Refill Last Dose  . ferrous sulfate 325 (65 FE) MG EC tablet Take 325 mg by mouth 3 (three) times daily with meals.   07/29/2017 at Unknown time  . ferrous sulfate 325 (65 FE) MG tablet Take 1 tablet (325 mg total) by mouth 2 (two) times daily with a meal. For anemia, take with Vitamin C 60 tablet 3   . ondansetron (ZOFRAN ODT) 4 MG disintegrating tablet Take 1 tablet (4 mg total) by mouth every 8 (eight) hours as needed for nausea or vomiting. (Patient not taking: Reported on 07/24/2017) 20 tablet 0 Not Taking at Unknown time  . oxyCODONE-acetaminophen (PERCOCET) 5-325 MG tablet Take 1-2 tablets by mouth every 6 (six) hours as needed for severe pain. (Patient not taking: Reported on 07/15/2017) 15 tablet 0 Not Taking at Unknown time  . Prenatal Vit-Fe Fumarate-FA (MULTIVITAMIN-PRENATAL) 27-0.8 MG TABS tablet Take 1 tablet by mouth daily at 12 noon.   Not Taking at Unknown time    Review of Systems - See  HPI for OB specific ROS.   Vitals:  BP 112/69 (BP Location: Left Arm)   Pulse 81   Temp 97.6 F (36.4 C) (Oral)   Resp 18   Ht 5\' 7"  (1.702 m)   Wt 90.7 kg (200 lb)   LMP 03/03/2017 (Approximate)   BMI 31.32 kg/m  Physical Examination: CONSTITUTIONAL: Well-developed, well-nourished female in no acute distress.  HENT:  Normocephalic, atraumatic EYES: Conjunctivae and EOM are normal. No scleral icterus.  NECK: Normal range of motion, supple, SKIN: Skin is warm and dry. No rash noted. Not diaphoretic. No erythema. No pallor. NEUROLGIC: Alert and oriented to person, place, and time. No gross cranial nerve deficit noted. PSYCHIATRIC: Normal mood and affect. Normal behavior. Normal judgment and thought content. CARDIOVASCULAR: Normal heart rate noted, regular rhythm RESPIRATORY: Effort and breath sounds normal, no  problems with respiration noted ABDOMEN: Soft, nontender, nondistended, gravid.  Cervix: closed/thick and high Membranes:intact Fetal Monitoring:Baseline: 160 bpm Tocometer: Flat  Labs:  No results found for this or any previous visit (from the past 24 hour(s)).  Imaging Studies: [place] Mfm Ob Comp + 14 Wk  Result Date: 07/01/2017 ----------------------------------------------------------------------  OBSTETRICS REPORT                      (Signed Final 07/01/2017 10:26 am) ---------------------------------------------------------------------- PATIENT INFO:  ID #:       [id number]                          D.O.B.:  16-Aug-1991 (25 yrs)  Name:       Heather Blackwell                   Visit Date: 07/01/2017 09:00 am ---------------------------------------------------------------------- PERFORMED BY:  Performed By:     Daisy Blossom,        Ref. Address:     [address]                    [address], [address]T                                                             [address],                                                             [place], [place]                                                             [address]  Referred By:      Alberteen Spindle CNM ---------------------------------------------------------------------- SERVICE(S) PROVIDED:   [place] MFM OB COMP + 14 WK  40981.19  ---------------------------------------------------------------------- INDICATIONS:   [redacted] weeks gestation of pregnancy                Z3A.17   Anatomy   Obesity (BMI 30)   Close interval pregnancy   History of c-section (April 2018)  ---------------------------------------------------------------------- FETAL EVALUATION:  Num Of Fetuses:     1  Fetal Heart         153  Rate(bpm):  Presentation:       variable  Placenta:           Fundal, No previa  Amniotic Fluid  AFI FV:      Within normal limits ---------------------------------------------------------------------- BIOMETRY:  BPD:      34.7  mm      G. Age:  16w 5d          8  %    CI:        67.24   %    70 - 86                                                          FL/HC:      18.7   %    15.8 - 18  HC:      135.5  mm     G. Age:  17w 0d          9  %                                                          FL/BPD:     72.9   %  FL:       25.3  mm     G. Age:  17w 5d         38  %  HUM:      23.7  mm     G. Age:  17w 2d         40  %  CER:        16  mm     G. Age:  16w 0d          8  %  CM:        3.7  mm ---------------------------------------------------------------------- GESTATIONAL AGE:  LMP:           17w 1d        Date:  03/03/17                 EDD:   12/08/17  U/S Today:     17w 1d                                        EDD:   12/08/17  Best:          17w 6d     Det. By:  U/S  (06/10/17)          EDD:   12/03/17 ---------------------------------------------------------------------- ANATOMY:  Cavum:                 CSP visualized  Aortic Arch:            Suboptimal  Ventricles:            Normal appearance      Diaphragm:              Within Normal Limits  Cerebellum:            Within Normal Limits   Stomach:                Seen  Posterior Fossa:       Within Normal Limits   Abdomen:                Within Normal                                                                        Limits  Nuchal Fold:           Within Normal Limits   Abdominal Wall:         Normal appearance  Face:                  Orbits visualized      Cord Vessels:           3 vessels  Lips:                  Suboptimal             Kidneys:                Normal appearance  Thoracic:              Within Normal Limits   Bladder:                Seen  Heart:                 Suboptimal             Spine:                  Normal appearance  RVOT:                  Normal appearance      Upper Extremities:      Visualized  LVOT:                  Normal appearance      Lower Extremities:      Visualized  Other:  Profile appears normal; Bicaval and 3 vessel trachea view  suboptimal ---------------------------------------------------------------------- CERVIX UTERUS ADNEXA:  Cervix  Length:           3.01  cm.  Right Ovary  Size(cm)       3.5  x   2.5    x  2.7       Vol(ml): 12.4  Simple Cyst = 2.3 x 2 x 2.8 cm ---------------------------------------------------------------------- IMPRESSION:  Single intrauterine pregnancy with a best estimated  gestational age of [redacted] weeks 6 days.  Dating is based on  [facility]; measurements were reporte as 14  weeks 6 days.  Detailed ultrasound performed for the indication of maternal  obesity (BMI 30).  Due to poor maternal acoustics, views of the nose/lip, 4  chamber heart, bicaval view and 3 vessel trachea view of the  heart were inadequately visualized.  Other fetal anatomy  visualized appears normal.  The right ovary contains a simple cyst measuring 2.3 x 2 x  2.8 cm.  The left ovary was not visualized.  Findings were discussed.  Will schedule follow up ultrasound  to complete the fetal anatomy in 2 weeks. ----------------------------------------------------------------------                  Kirby Funk, MD Electronically Signed Final Report   07/01/2017 10:26 am ----------------------------------------------------------------------  [place] Mfm Ob Follow Up  Result Date: 07/15/2017 ----------------------------------------------------------------------  OBSTETRICS REPORT                      (Signed Final 07/15/2017 09:04 am) ---------------------------------------------------------------------- PATIENT INFO:  ID #:       [id number]                          D.O.B.:  02-Jul-1991 (25 yrs)  Name:       [facility] Ramone                   Visit Date: 07/15/2017 08:55 am ---------------------------------------------------------------------- PERFORMED BY:  Performed By:     Lenoria Farrier            Ref. Address:     [address],                                                             [place], [place]                                                             [address]  Referred By:      Alberteen Spindle CNM ---------------------------------------------------------------------- SERVICE(S) PROVIDED:   [place] MFM OB FOLLOW UP                                  [phone]  ---------------------------------------------------------------------- INDICATIONS:   [redacted] weeks gestation of pregnancy                Z3A.19   Anatomy follow up   Obesity (BMI 30)   Prior c-section  ---------------------------------------------------------------------- FETAL EVALUATION:  Num Of Fetuses:     1  Fetal Heart         152  Rate(bpm):  Fetal Lie:          Maternal Right  Presentation:  Cephalic  Placenta:           Fundal Grade 0, No previa ---------------------------------------------------------------------- GESTATIONAL AGE:  LMP:           19w 1d        Date:  03/03/17                 EDD:   12/08/17  Best:          19w 6d     Det. By:  U/S  (06/10/17)          EDD:   12/03/17 ---------------------------------------------------------------------- ANATOMY:  Lips:                  Normal appearance      Heart:                  4-Chamber view                                                                        appears normal  Other:  3 vessel trachea and bicaval views appear normal ---------------------------------------------------------------------- CERVIX UTERUS ADNEXA:  Cervix  Length:              4  cm. ---------------------------------------------------------------------- IMPRESSION:  Single intrauterine pregnancy with a best estimated  gestational age of [redacted] weeks 6 days.  Dating is based on  earliest available ultrasound [facility] on 06/10/2017; measurements were reporte as 14  weeks 6 days.  Follow up exam performed to complete the fetal anatomy.  Four chamber, bicaval view, 3 vessel trachea  and nose/lips  appear normal.  Other fetal anatomy has been documented  previously.  Findings were discussed. ----------------------------------------------------------------------                  Kirby Funk, MD Electronically Signed Final Report   07/15/2017 09:04 am ----------------------------------------------------------------------    Assessment and Plan: Patient Active Problem List   Diagnosis Date Noted  . Encounter for antenatal screening for malformation using ultrasound   . UTI (urinary tract infection) during pregnancy 01/15/2017  . Obesity affecting pregnancy 01/15/2017  . Gestational hypertension 01/15/2017  . Supervision of high risk pregnancy in third trimester 01/15/2017  . Indication for care in labor or delivery 01/14/2017  . NST (non-stress test) reactive 12/21/2016    1. Reassuring ultrasound. Cervix long, thick and closed. Home with flexeril. Keep prenatal appointment in 2 weeks.   Cline Cools, MD, MPH

## 2017-08-17 ENCOUNTER — Encounter: Payer: Self-pay | Admitting: Emergency Medicine

## 2017-08-17 ENCOUNTER — Other Ambulatory Visit: Payer: Self-pay

## 2017-08-17 ENCOUNTER — Emergency Department
Admission: EM | Admit: 2017-08-17 | Discharge: 2017-08-17 | Disposition: A | Discharge status: Home / Self Care | Payer: Medicaid Other | Attending: Emergency Medicine | Admitting: Emergency Medicine

## 2017-08-17 DIAGNOSIS — Z79899 Other long term (current) drug therapy: Secondary | ICD-10-CM | POA: Insufficient documentation

## 2017-08-17 DIAGNOSIS — Z3A24 24 weeks gestation of pregnancy: Secondary | ICD-10-CM | POA: Insufficient documentation

## 2017-08-17 DIAGNOSIS — O24419 Gestational diabetes mellitus in pregnancy, unspecified control: Secondary | ICD-10-CM | POA: Insufficient documentation

## 2017-08-17 DIAGNOSIS — F329 Major depressive disorder, single episode, unspecified: Secondary | ICD-10-CM | POA: Diagnosis not present

## 2017-08-17 DIAGNOSIS — Z91040 Latex allergy status: Secondary | ICD-10-CM | POA: Insufficient documentation

## 2017-08-17 DIAGNOSIS — O26892 Other specified pregnancy related conditions, second trimester: Secondary | ICD-10-CM | POA: Diagnosis present

## 2017-08-17 DIAGNOSIS — F32A Depression, unspecified: Secondary | ICD-10-CM

## 2017-08-17 DIAGNOSIS — D649 Anemia, unspecified: Secondary | ICD-10-CM | POA: Diagnosis not present

## 2017-08-17 DIAGNOSIS — R45851 Suicidal ideations: Secondary | ICD-10-CM | POA: Diagnosis not present

## 2017-08-17 LAB — COMPREHENSIVE METABOLIC PANEL
ALK PHOS: 65 U/L (ref 38–126)
ALT: 8 U/L — AB (ref 14–54)
AST: 13 U/L — ABNORMAL LOW (ref 15–41)
Albumin: 3.2 g/dL — ABNORMAL LOW (ref 3.5–5.0)
Anion gap: 9 (ref 5–15)
BUN: 6 mg/dL (ref 6–20)
CALCIUM: 8.6 mg/dL — AB (ref 8.9–10.3)
CHLORIDE: 107 mmol/L (ref 101–111)
CO2: 23 mmol/L (ref 22–32)
CREATININE: 0.38 mg/dL — AB (ref 0.44–1.00)
GFR calc Af Amer: >60 mL/min (ref >60)
Glucose, Bld: 74 mg/dL (ref 65–99)
Potassium: 3.8 mmol/L (ref 3.5–5.1)
Sodium: 139 mmol/L (ref 135–145)
Total Bilirubin: 0.4 mg/dL (ref 0.3–1.2)
Total Protein: 6.9 g/dL (ref 6.5–8.1)

## 2017-08-17 LAB — ACETAMINOPHEN LEVEL: Acetaminophen (Tylenol), Serum: <10 ug/mL — ABNORMAL LOW (ref 10–30)

## 2017-08-17 LAB — CBC
HCT: 34.4 % — ABNORMAL LOW (ref 35.0–47.0)
Hemoglobin: 11.7 g/dL — ABNORMAL LOW (ref 12.0–16.0)
MCH: 30.1 pg (ref 26.0–34.0)
MCHC: 34.1 g/dL (ref 32.0–36.0)
MCV: 88.5 fL (ref 80.0–100.0)
PLATELETS: 272 10*3/uL (ref 150–440)
RBC: 3.88 MIL/uL (ref 3.80–5.20)
RDW: 14.9 % — AB (ref 11.5–14.5)
WBC: 6.5 10*3/uL (ref 3.6–11.0)

## 2017-08-17 LAB — URINE DRUG SCREEN, QUALITATIVE (ARMC ONLY)
Amphetamines, Ur Screen: NOT DETECTED
BARBITURATES, UR SCREEN: NOT DETECTED
Benzodiazepine, Ur Scrn: NOT DETECTED
CANNABINOID 50 NG, UR ~~LOC~~: NOT DETECTED
Cocaine Metabolite,Ur ~~LOC~~: NOT DETECTED
MDMA (Ecstasy)Ur Screen: NOT DETECTED
Methadone Scn, Ur: NOT DETECTED
Opiate, Ur Screen: NOT DETECTED
Phencyclidine (PCP) Ur S: NOT DETECTED
TRICYCLIC, UR SCREEN: NOT DETECTED

## 2017-08-17 LAB — SALICYLATE LEVEL

## 2017-08-17 LAB — ETHANOL

## 2017-08-17 LAB — POCT PREGNANCY, URINE: PREG TEST UR: POSITIVE — AB

## 2017-08-17 MED ORDER — FLUOXETINE HCL 20 MG PO CAPS: 20.0000 mg | ORAL_CAPSULE | Freq: Every day | ORAL | 2 refills | Status: AC

## 2017-08-17 MED ORDER — OLANZAPINE 5 MG PO TABS: 2.5000 mg | ORAL_TABLET | Freq: Every day | ORAL | 0 refills | Status: AC

## 2017-08-17 NOTE — ED Notes (Signed)
Report given to SOC MD.  

## 2017-08-17 NOTE — ED Notes (Signed)
BEHAVIORAL HEALTH ROUNDING  Patient sleeping: No.  Patient alert and oriented: yes  Behavior appropriate: Yes. ; If no, describe:  Nutrition and fluids offered: Yes  Toileting and hygiene offered: Yes  Sitter present: not applicable, Q 15 min safety rounds and observation.  Law enforcement present: Yes ODS  

## 2017-08-17 NOTE — ED Notes (Signed)
SOC complete.  

## 2017-08-17 NOTE — ED Triage Notes (Signed)
Pt to ED voluntary for SI. Pt states that she has hx/o SI and has been having thoughts of self harm on and off for the pat few months, pt states that at one point she had a plan and that it was "a couple of different things". Pt denies currently having a plan. Pt states that she has a hx/o depression. Pt states that she is having problems with the father of her children. Pt states that she has a 247 month old at home and is currently 24-[redacted] weeks pregnant. Pt [facility] from the [facility] department. Pt denies any complaints related to pregnancy at this time.

## 2017-08-17 NOTE — ED Notes (Addendum)
Fetal heart tones 151.

## 2017-08-17 NOTE — ED Notes (Signed)
Pt belongings have been place in pt belonging bag. It includes: 1 pair of black shoes: 1 white shirt; 1 pair of black pants: 1 small handbag; 1 black cell phone; 1 gold ring, which was place in a small container and labeled with patent information. Pt belongings have been given to RN, Sunny SchleinFelicia.

## 2017-08-17 NOTE — ED Provider Notes (Addendum)
[facility] Emergency Department Provider Note   ____________________________________________    I have reviewed the triage vital signs and the nursing notes.   HISTORY  Chief Complaint suicidal ideation     HPI Heather Blackwell is a 26 y.o. female who presents with complaints of depression and suicidal ideation.  Patient reports she feels overwhelmed.  She is having financial difficulty and recently her children's father apparently told her that he did not want to be with her.  Patient reports a long history of depression and sees a counselor weekly at the health department.  She reports she is approximately [redacted] weeks pregnant this is her third pregnancy, parity 1.  Denies abdominal pain, no vaginal bleeding.  Does not have a plan to hurt herself   Past Medical History:  Diagnosis Date  . Anemia   . Anxiety   . Depression     Patient Active Problem List   Diagnosis Date Noted  . Encounter for antenatal screening for malformation using ultrasound   . UTI (urinary tract infection) during pregnancy 01/15/2017  . Obesity affecting pregnancy 01/15/2017  . Gestational hypertension 01/15/2017  . Supervision of high risk pregnancy in third trimester 01/15/2017  . Indication for care in labor or delivery 01/14/2017  . NST (non-stress test) reactive 12/21/2016    Past Surgical History:  Procedure Laterality Date  . CESAREAN SECTION N/A 01/14/2017   Performed by Ward, Elenora Fenderhelsea C, [facility]  . DENTAL SURGERY      Prior to Admission medications   Medication Sig Start Date End Date Taking? Authorizing Provider  cyclobenzaprine (FLEXERIL) 10 MG tablet Take 1 tablet (10 mg total) by mouth 3 (three) times daily as needed for muscle spasms. 07/30/17   Christeen DouglasBeasley, Bethany, MD  ferrous sulfate 325 (65 FE) MG EC tablet Take 325 mg by mouth 3 (three) times daily with meals.    [provider]  ferrous sulfate 325 (65 FE) MG tablet Take 1 tablet (325 mg  total) by mouth 2 (two) times daily with a meal. For anemia, take with Vitamin C 01/17/17 03/18/17  Christeen DouglasBeasley, Bethany, MD  ondansetron (ZOFRAN ODT) 4 MG disintegrating tablet Take 1 tablet (4 mg total) by mouth every 8 (eight) hours as needed for nausea or vomiting. Patient not taking: Reported on 07/24/2017 01/17/17   Christeen DouglasBeasley, Bethany, MD  oxyCODONE-acetaminophen (PERCOCET) 5-325 MG tablet Take 1-2 tablets by mouth every 6 (six) hours as needed for severe pain. Patient not taking: Reported on 07/15/2017 01/17/17   Christeen DouglasBeasley, Bethany, MD  Prenatal Vit-Fe Fumarate-FA (MULTIVITAMIN-PRENATAL) 27-0.8 MG TABS tablet Take 1 tablet by mouth daily at 12 noon.    [provider]     Allergies Gelatin; Latex; and Penicillins  No family history on file.  Social History Social History   Tobacco Use  . Smoking status: Never Smoker  . Smokeless tobacco: Never Used  Substance Use Topics  . Alcohol use: No  . Drug use: No    Review of Systems  Constitutional: No fever/chills Eyes: No visual changes.  ENT: No sore throat. Cardiovascular: Denies chest pain. Respiratory: Denies shortness of breath. Gastrointestinal: No abdominal pain.  No nausea, no vomiting.   Genitourinary: No vaginal bleeding Musculoskeletal: Negative for back pain. Skin: Negative for rash. Neurological: Negative for headaches   ____________________________________________   PHYSICAL EXAM:  VITAL SIGNS: ED Triage Vitals [08/17/17 1642]  Enc Vitals Group     BP 125/84     Pulse Rate 97  Resp      Temp 97.7 F (36.5 C)     Temp Source Oral     SpO2 98 %     Weight 93 kg (205 lb)     Height 1.702 m (5\' 7" )     Head Circumference      Peak Flow      Pain Score      Pain Loc      Pain Edu?      Excl. in GC?     Constitutional: Alert and oriented. No acute distress.  Tearful Eyes: Conjunctivae are normal.  Head: Atraumatic. Nose: No congestion/rhinnorhea. Mouth/Throat: Mucous membranes are moist.     Neck:  Painless ROM Cardiovascular: Normal rate, regular rhythm.   Good peripheral circulation. Respiratory: Normal respiratory effort.  No retractions.  Gastrointestinal: Soft and nontender. No distention.   Genitourinary: deferred Musculoskeletal:   Warm and well perfused Neurologic:  Normal speech and language. No gross focal neurologic deficits are appreciated.  Skin:  Skin is warm, dry and intact. No rash noted. Psychiatric: Depressed mood, tearful, normal affect  ____________________________________________   LABS (all labs ordered are listed, but only abnormal results are displayed)  Labs Reviewed  COMPREHENSIVE METABOLIC PANEL - Abnormal; Notable for the following components:      Result Value   Creatinine, Ser 0.38 (*)    Calcium 8.6 (*)    Albumin 3.2 (*)    AST 13 (*)    ALT 8 (*)    All other components within normal limits  ACETAMINOPHEN LEVEL - Abnormal; Notable for the following components:   Acetaminophen (Tylenol), Serum <10 (*)    All other components within normal limits  CBC - Abnormal; Notable for the following components:   Hemoglobin 11.7 (*)    HCT 34.4 (*)    RDW 14.9 (*)    All other components within normal limits  POCT PREGNANCY, URINE - Abnormal; Notable for the following components:   Preg Test, Ur POSITIVE (*)    All other components within normal limits  ETHANOL  SALICYLATE LEVEL  URINE DRUG SCREEN, QUALITATIVE ([facility] ONLY)  POC URINE PREG, ED   ____________________________________________  EKG  None ____________________________________________  RADIOLOGY  None ____________________________________________   PROCEDURES  Procedure(s) performed: No  Procedures   Critical Care performed: No ____________________________________________   INITIAL IMPRESSION / ASSESSMENT AND PLAN / ED COURSE  Pertinent labs & imaging results that were available during my care of the patient were reviewed by me and considered in my medical  decision making (see chart for details).  Patient presents with depression with suicidal ideation.  This is a somewhat chronic condition for her and as such I do not feel she meets criteria for IVC, will consult TTS until psychiatry.  Lab work is reassuring.  Patient seen by tele-psychiatry, they recommend discharge with prescription for fluoxetine Zyprexa and follow-up with gynecologist.  Discussed this with patient and she agrees with this plan.  She knows she can return at any time.    ____________________________________________   FINAL CLINICAL IMPRESSION(S) / ED DIAGNOSES  Final diagnoses:  Depression, unspecified depression type        Note:  This document was prepared using Dragon voice recognition software and may include unintentional dictation errors.    Jene EveryKinner, Laelia Angelo, MD 08/17/17 2135    Jene EveryKinner, Bernestine Holsapple, MD 08/17/17 2206

## 2017-08-17 NOTE — ED Notes (Signed)
ENVIRONMENTAL ASSESSMENT  Potentially harmful objects out of patient reach: Yes.  Personal belongings secured: Yes.  Patient dressed in hospital provided attire only: Yes.  Plastic bags out of patient reach: Yes.  Patient care equipment (cords, cables, call bells, lines, and drains) shortened, removed, or accounted for: Yes.  Equipment and supplies removed from bottom of stretcher: Yes.  Potentially toxic materials out of patient reach: Yes.  Sharps container removed or out of patient reach: Yes.   BEHAVIORAL HEALTH ROUNDING  Patient sleeping: No.  Patient alert and oriented: yes  Behavior appropriate: Yes. ; If no, describe:  Nutrition and fluids offered: Yes  Toileting and hygiene offered: Yes  Sitter present: not applicable, Q 15 min safety rounds and observation.  Law enforcement present: Yes ODS  ED BHU PLACEMENT JUSTIFICATION  Is the patient under IVC or is there intent for IVC: no.  Is the patient medically cleared: Yes.  Is there vacancy in the ED BHU: Yes.  Is the population mix appropriate for patient: Yes.  Is the patient awaiting placement in inpatient or outpatient setting: Yes.  Has the patient had a psychiatric consult: pending.  Survey of unit performed for contraband, proper placement and condition of furniture, tampering with fixtures in bathroom, shower, and each patient room: Yes. ; Findings: All clear  APPEARANCE/BEHAVIOR  calm, cooperative and adequate rapport can be established  NEURO ASSESSMENT  Orientation: time, place and person  Hallucinations: No.None noted (Hallucinations)  Speech: Normal  Gait: normal  RESPIRATORY ASSESSMENT  WNL  CARDIOVASCULAR ASSESSMENT  WNL  GASTROINTESTINAL ASSESSMENT  WNL  EXTREMITIES  WNL  PLAN OF CARE  Provide calm/safe environment. Vital signs assessed TID. ED BHU Assessment once each 12-hour shift. Collaborate with TTS daily or as condition indicates. Assure the ED provider has rounded once each shift. Provide and  encourage hygiene. Provide redirection as needed. Assess for escalating behavior; address immediately and inform ED provider.  Assess family dynamic and appropriateness for visitation as needed: Yes. ; If necessary, describe findings:  Educate the patient/family about BHU procedures/visitation: Yes. ; If necessary, describe findings: Pt is calm and cooperative at this time. Pt understanding and accepting of unit procedures/rules. Will continue to monitor with Q 15 min safety rounds and observation.

## 2017-11-28 ENCOUNTER — Ambulatory Visit: Payer: Self-pay

## 2017-12-05 ENCOUNTER — Ambulatory Visit: Payer: Self-pay

## 2018-02-25 ENCOUNTER — Encounter (HOSPITAL_COMMUNITY): Payer: Self-pay

## 2018-06-06 IMAGING — US US MFM OB COMP +14 WKS
1 series · 12 of 28 positions shown · non-contrast
Comparison: none

PATIENT INFO:

PERFORMED BY:
SERVICE(S) PROVIDED:
INDICATIONS:
17 weeks gestation of pregnancy
Anatomy
Obesity (BMI 30)
Close interval pregnancy
History of c-section (December 2016)
FETAL EVALUATION:
Num Of Fetuses:     1
Fetal Heart         153
Rate(bpm):
Presentation:       variable
Placenta:           Fundal, No previa
Amniotic Fluid
AFI FV:      Within normal limits
BIOMETRY:
BPD:      34.7  mm     G. Age:  16w 5d          8  %    CI:        67.24   %    70 - 86
FL/HC:      18.7   %    15.8 - 18
HC:      135.5  mm     G. Age:  17w 0d          9  %
FL/BPD:     72.9   %
FL:       25.3  mm     G. Age:  17w 5d         38  %
HUM:      23.7  mm     G. Age:  17w 2d         40  %
CER:        16  mm     G. Age:  16w 0d          8  %
CM:        3.7  mm
GESTATIONAL AGE:
LMP:           17w 1d        Date:  03/03/17                 EDD:   12/08/17
U/S Today:     17w 1d                                        EDD:   12/08/17
Best:          17w 6d     Det. By:  U/S  (06/10/17)          EDD:   12/03/17
ANATOMY:
Cavum:                 CSP visualized         Aortic Arch:            Suboptimal
Ventricles:            Normal appearance      Diaphragm:              Within Normal Limits
Cerebellum:            Within Normal Limits   Stomach:                Seen
Posterior Fossa:       Within Normal Limits   Abdomen:                Within Normal
Limits
Nuchal Fold:           Within Normal Limits   Abdominal Wall:         Normal appearance
Face:                  Orbits visualized      Cord Vessels:           3 vessels
Lips:                  Suboptimal             Kidneys:                Normal appearance
Thoracic:              Within Normal Limits   Bladder:                Seen
Heart:                 Suboptimal             Spine:                  Normal appearance
RVOT:                  Normal appearance      Upper Extremities:      Visualized
LVOT:                  Normal appearance      Lower Extremities:      Visualized
Other:  Profile appears normal; Bicaval and 3 vessel trachea view suboptimal
CERVIX UTERUS ADNEXA:
Cervix
Length:           3.01  cm.
Right Ovary
Size(cm)       3.5  x   2.5    x  2.7       Vol(ml):
Simple Cyst = 2.3 x 2 x 2.8 cm

[Series 1: us mfm ob comp +14 wks · 0.20mm/px · 12 of 90 slices shown]
[im 4/90]
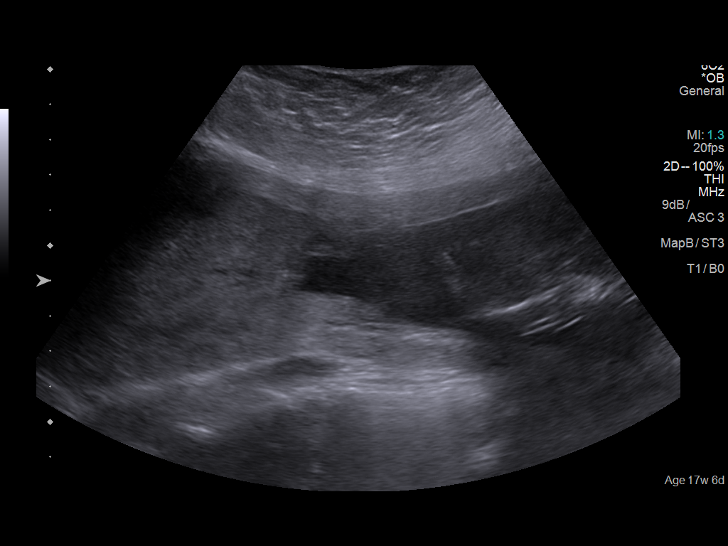
[im 10/90]
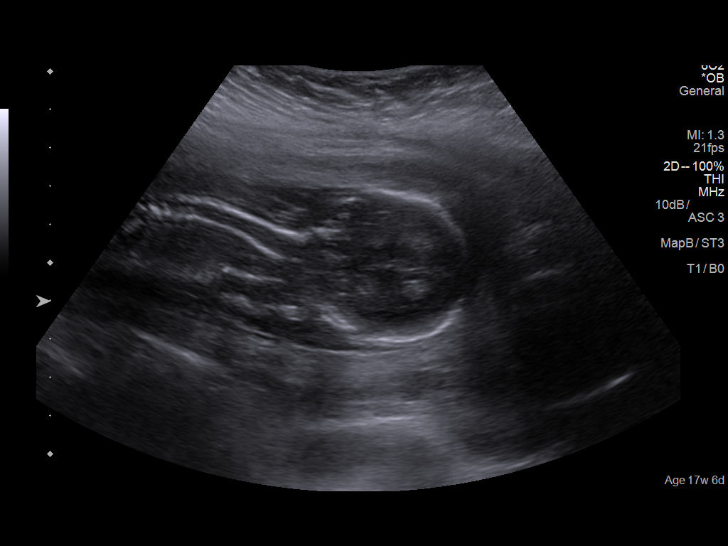
[im 17/90]
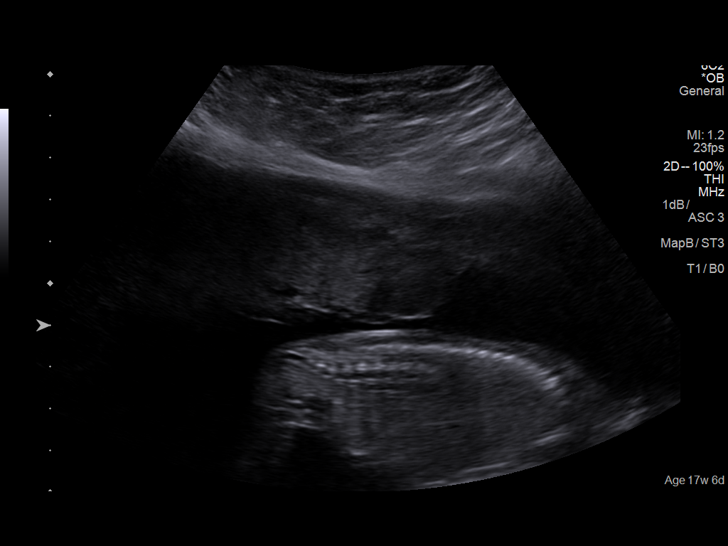
[im 27/90]
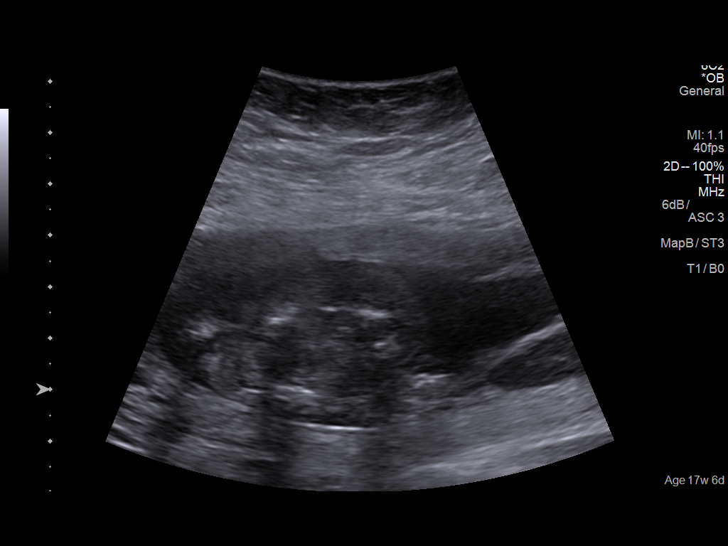
[im 33/90]
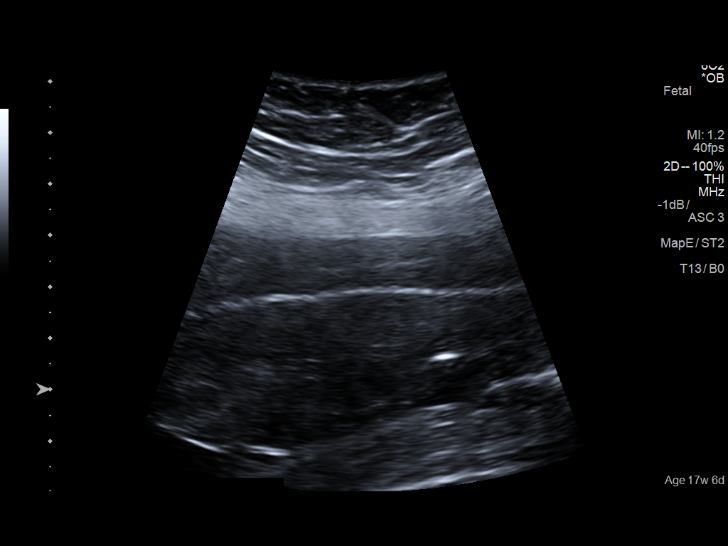
[im 40/90]
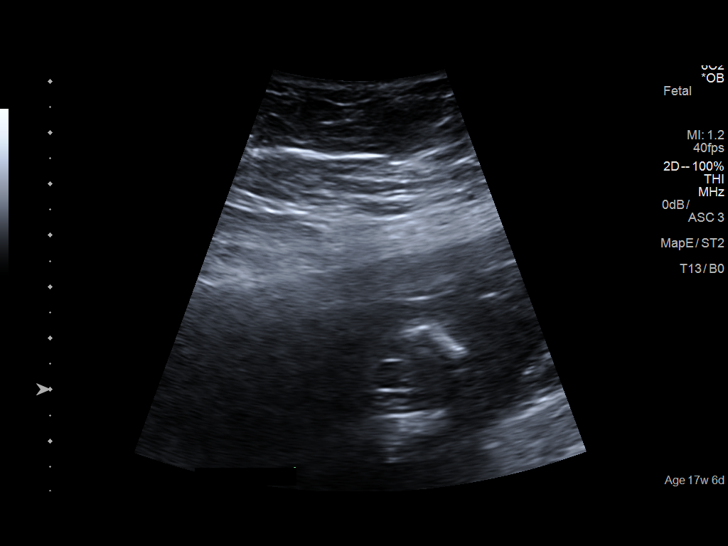
[im 50/90]
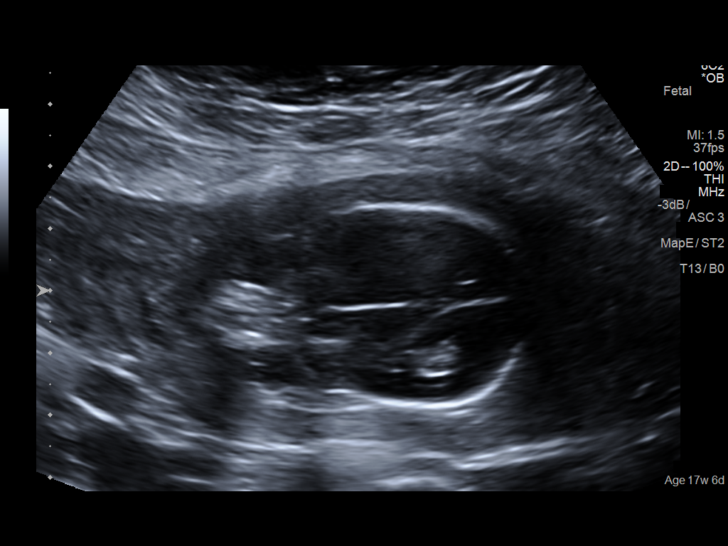
[im 57/90]
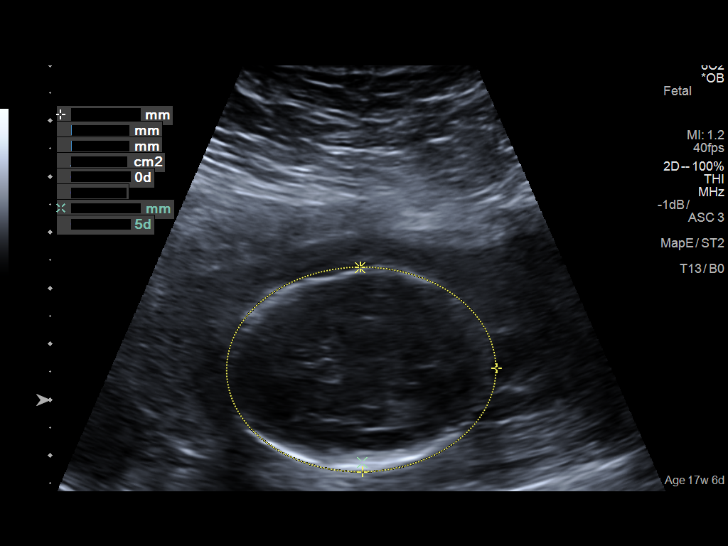
[im 63/90]
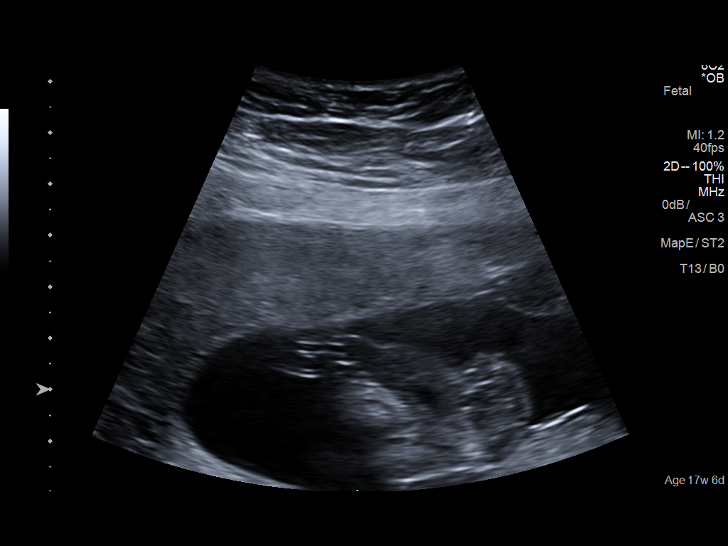
[im 73/90]
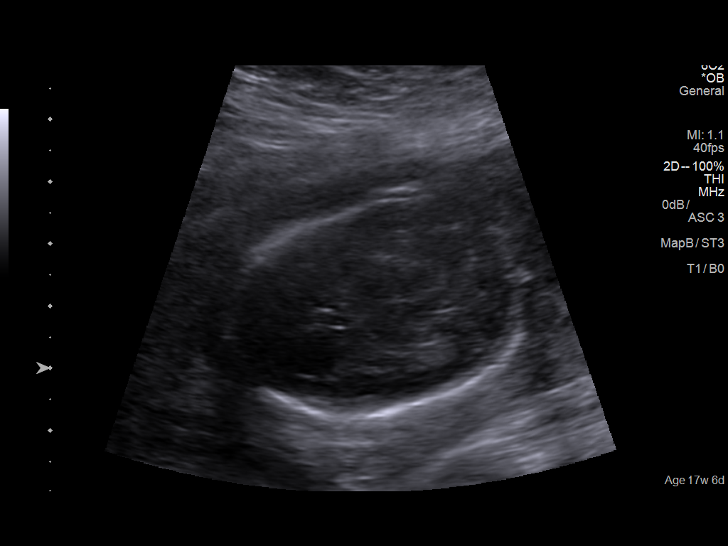
[im 80/90]
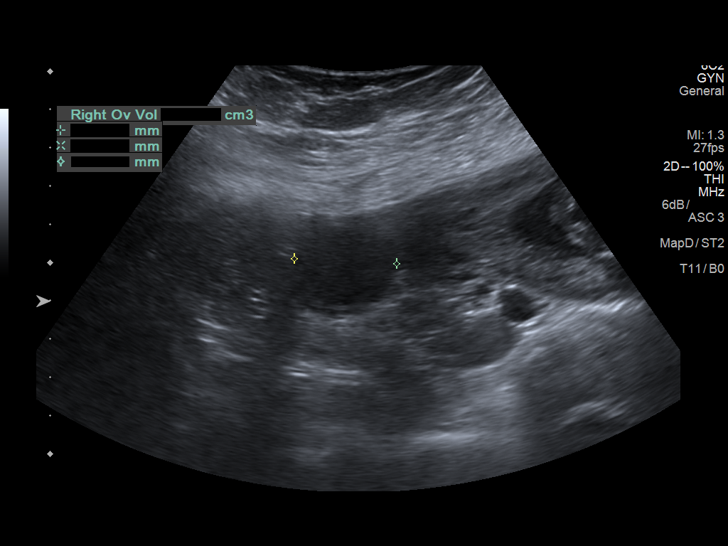
[im 86/90]
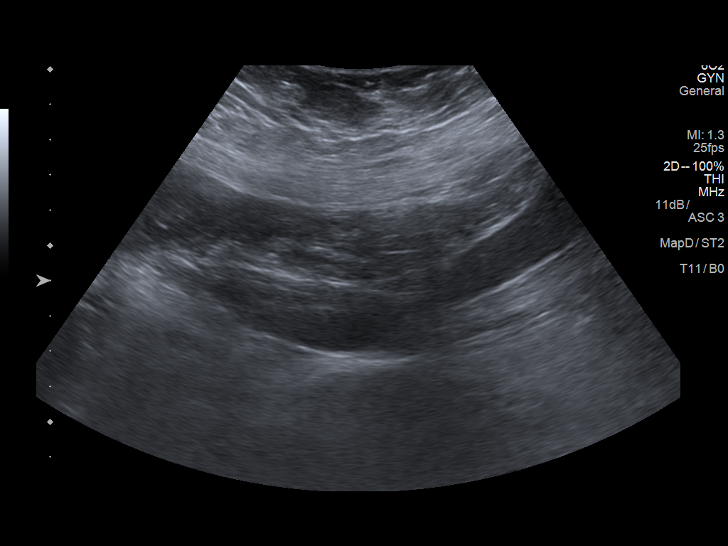

[12 of 28 positions shown; findings below may reference images not displayed]

IMPRESSION: Single intrauterine pregnancy with a best estimated
gestational age of 17 weeks 6 days.  Dating is based on
earliest available ultrasound performed at [facility]
[[facility]] on 06/10/2017; measurements were reporte as 14
weeks 6 days.

Detailed ultrasound performed for the indication of maternal
obesity (BMI 30).

Due to poor maternal acoustics, views of the nose/lip, 4
chamber heart, bicaval view and 3 vessel trachea view of the
heart were inadequately visualized.  Other fetal anatomy
visualized appears normal.

The right ovary contains a simple cyst measuring 2.3 x 2 x
2.8 cm.  The left ovary was not visualized.

Findings were discussed.  Will schedule follow up ultrasound
to complete the fetal anatomy in 2 weeks.

## 2018-06-20 IMAGING — US US MFM OB FOLLOW-UP
1 series · 13 of 28 positions shown · non-contrast
Comparison: none

PATIENT INFO:

PERFORMED BY:
SERVICE(S) PROVIDED:
INDICATIONS:
19 weeks gestation of pregnancy
Anatomy follow up
Obesity (BMI 30)
Prior c-section
FETAL EVALUATION:
Num Of Fetuses:     1
Fetal Heart         152
Rate(bpm):
Fetal Lie:          Maternal Right
Presentation:       Cephalic
Placenta:           Fundal Grade 0, No previa
GESTATIONAL AGE:
LMP:           19w 1d        Date:  03/03/17                 EDD:   12/08/17
Best:          19w 6d     Det. By:  U/S  (06/10/17)          EDD:   12/03/17
ANATOMY:
Lips:                  Normal appearance      Heart:                  4-Chamber view
appears normal
Other:  3 vessel trachea and bicaval views appear normal
CERVIX UTERUS ADNEXA:
Cervix
Length:              4  cm.

[Series 1: us mfm ob follow-up · 0.23mm/px · 13 of 40 slices shown]
[im 2/40]
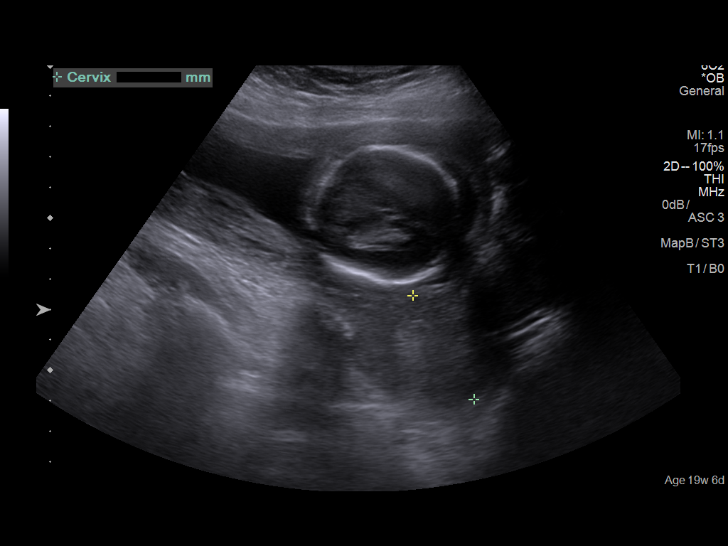
[im 5/40]
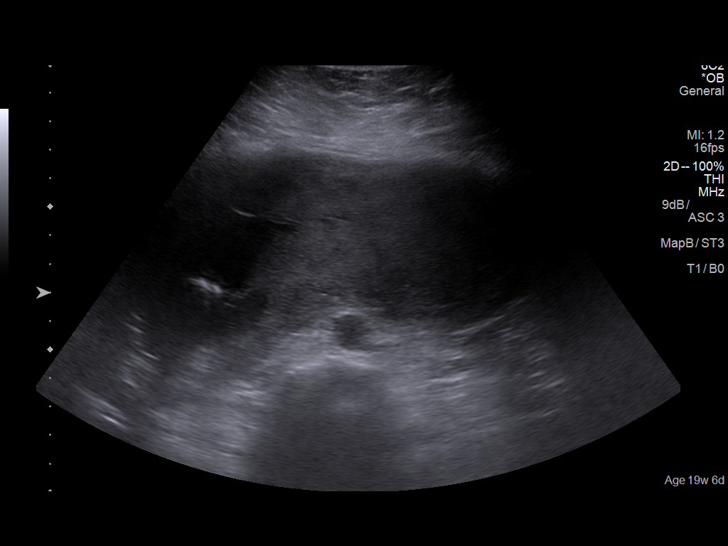
[im 8/40]
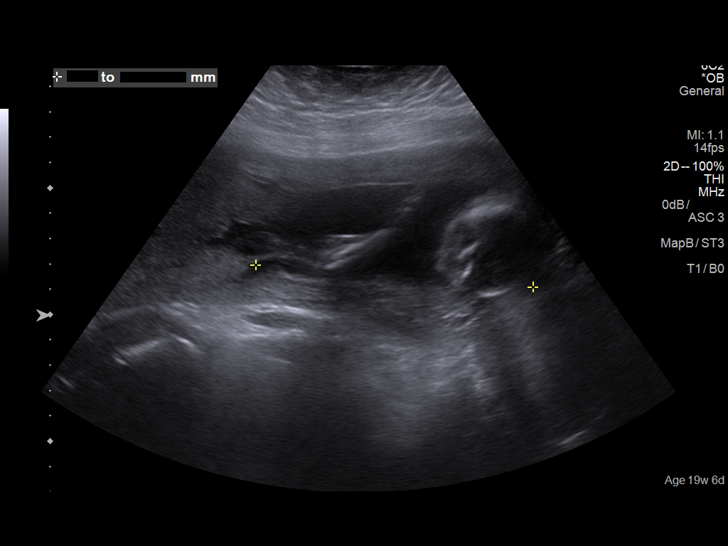
[im 11/40]
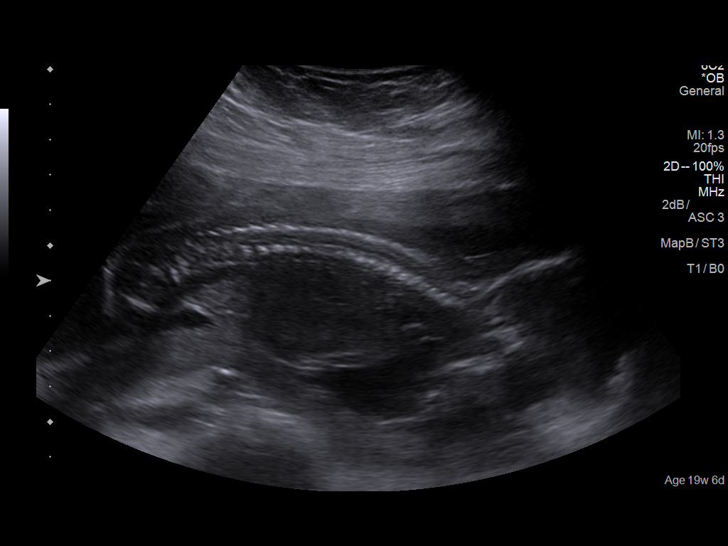
[im 14/40]
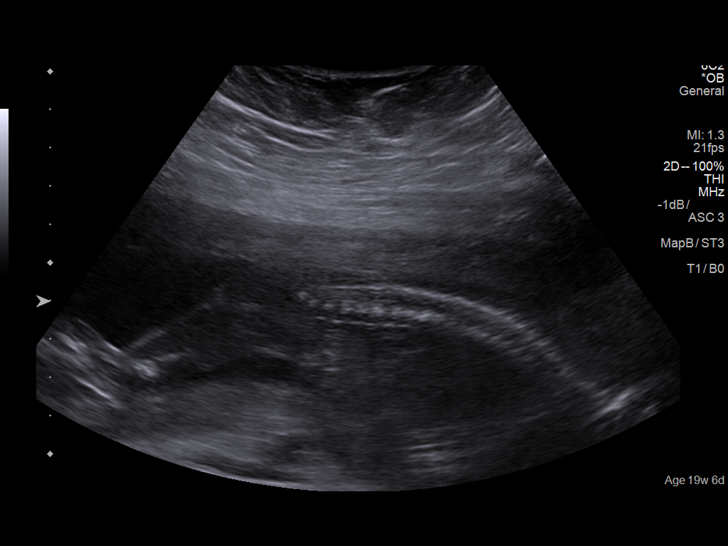
[im 16/40]
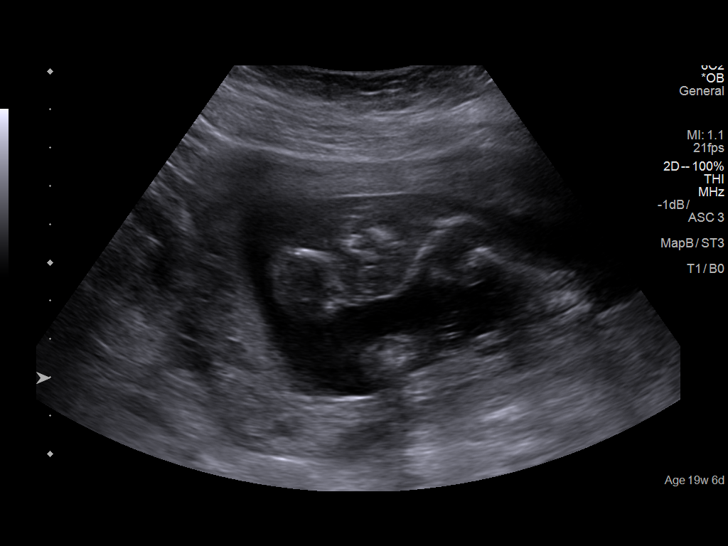
[im 21/40]
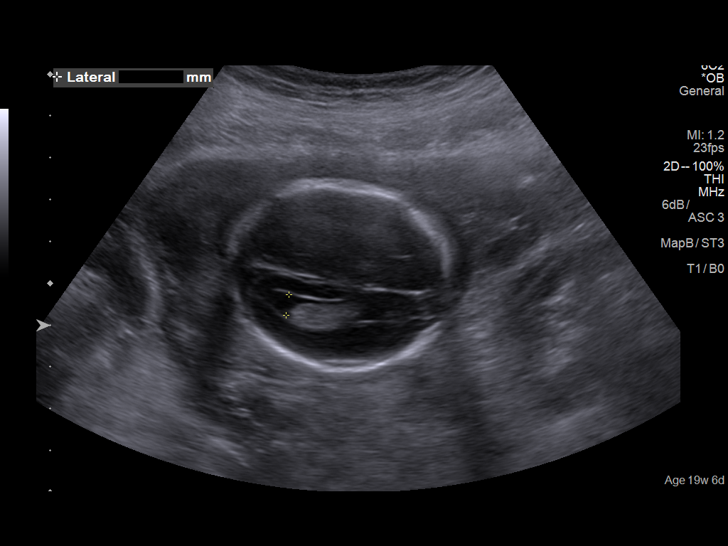
[im 24/40]
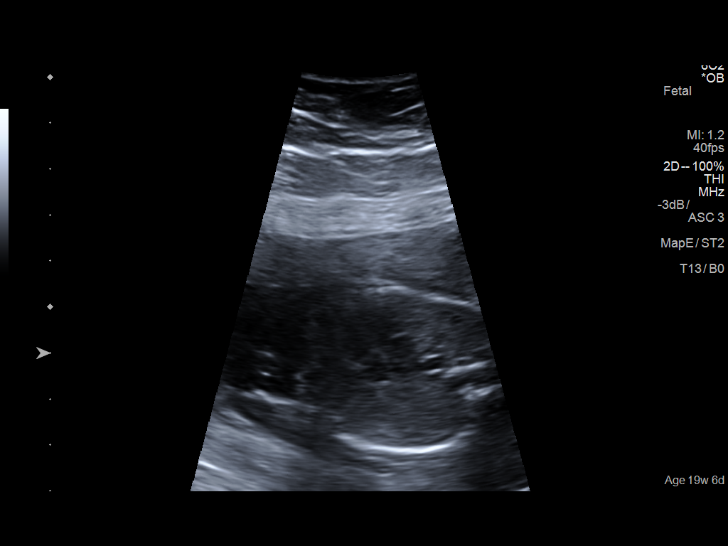
[im 27/40]
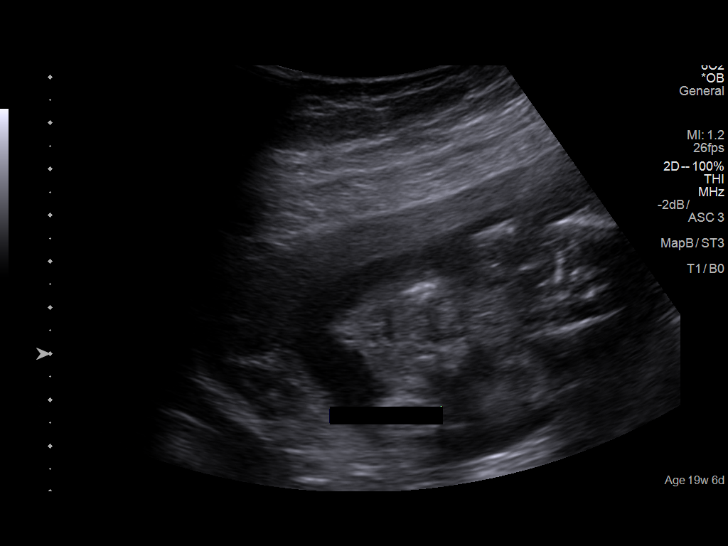
[im 29/40]
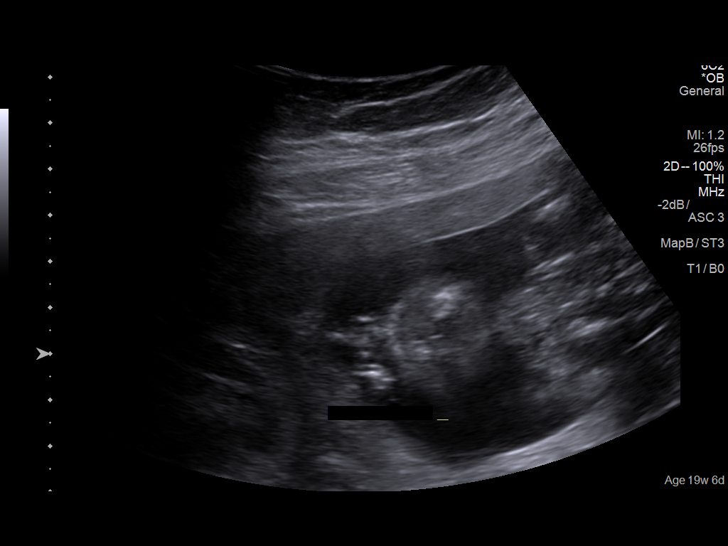
[im 32/40]
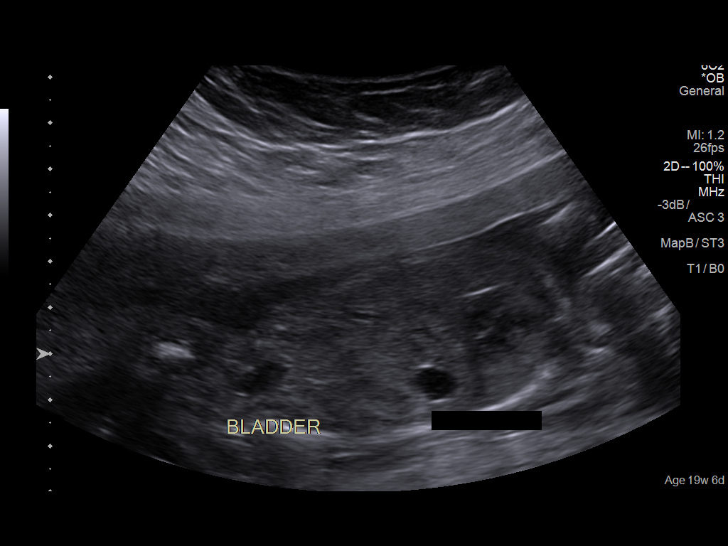
[im 35/40]
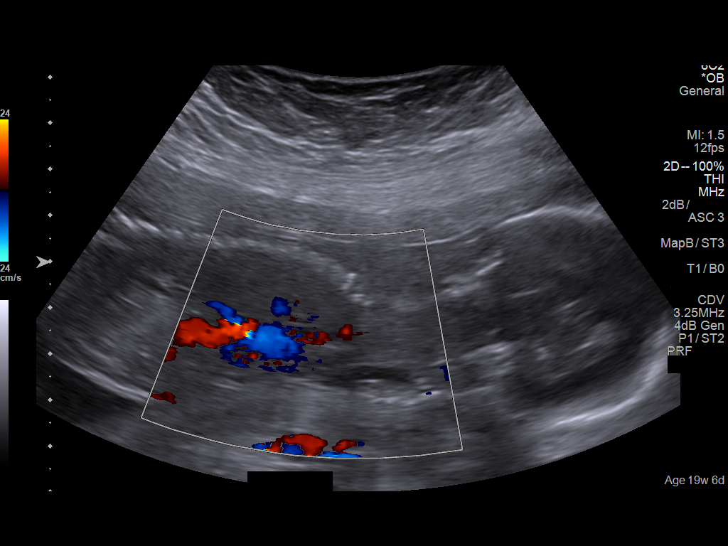
[im 38/40]
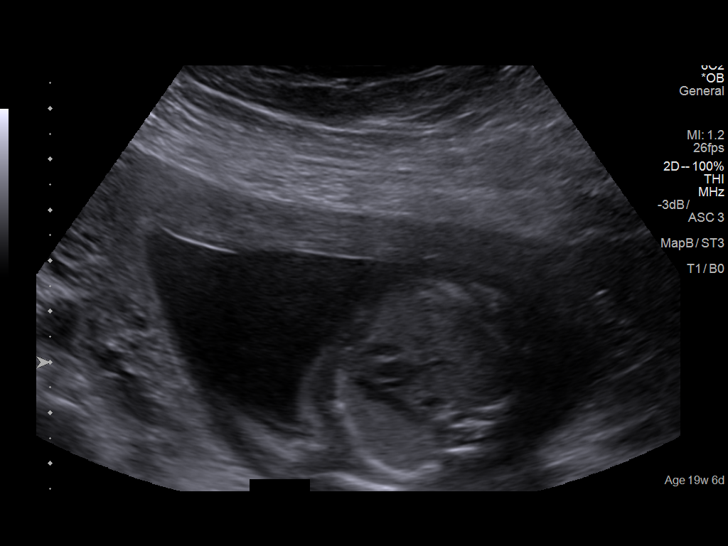

[13 of 28 positions shown; findings below may reference images not displayed]

IMPRESSION: Single intrauterine pregnancy with a best estimated
gestational age of 19 weeks 6 days.  Dating is based on
earliest available ultrasound performed at [facility]
[[facility]] on 06/10/2017; measurements were reporte as 14
weeks 6 days.

Follow up exam performed to complete the fetal anatomy.
Four chamber, bicaval view, 3 vessel trachea and nose/lips
appear normal.  Other fetal anatomy has been documented
previously.
Findings were discussed.

## 2018-07-05 IMAGING — US US OB LIMITED
1 series · 14 of 28 positions shown · non-contrast
Comparison: none

CLINICAL DATA: Pelvic cramping for 1 week.

EXAM:
LIMITED OBSTETRIC ULTRASOUND

[Series 1: us ob limited · 0.28mm/px · 14 of 28 slices shown]
[im 2/28]
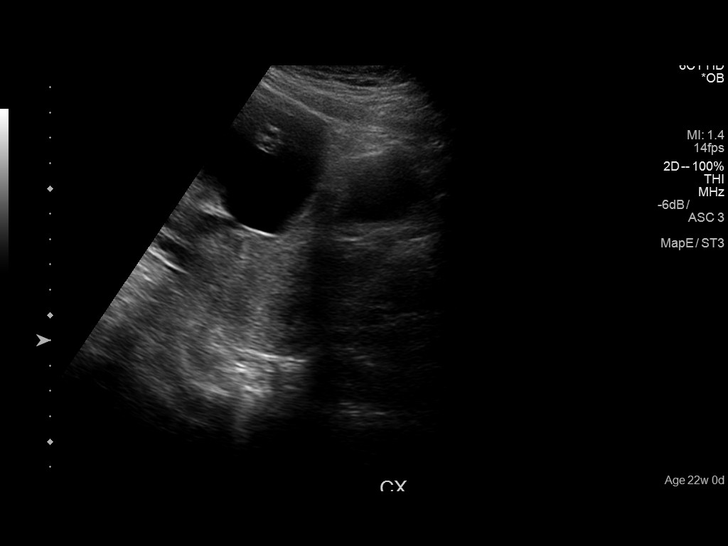
[im 4/28]
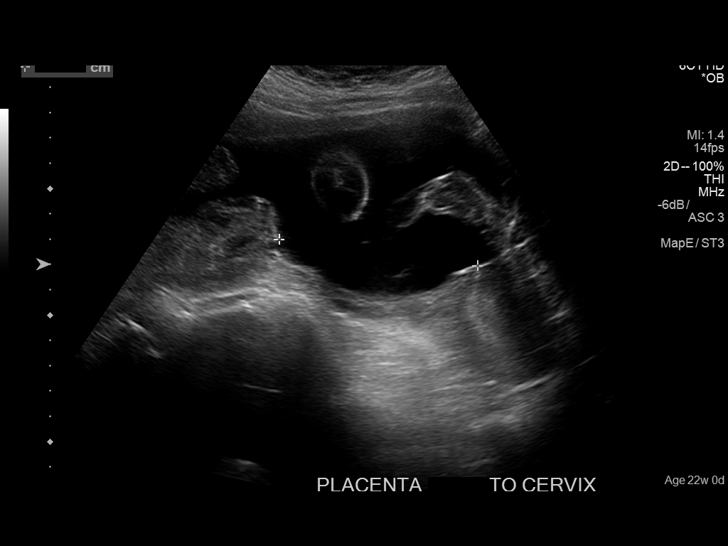
[im 6/28]
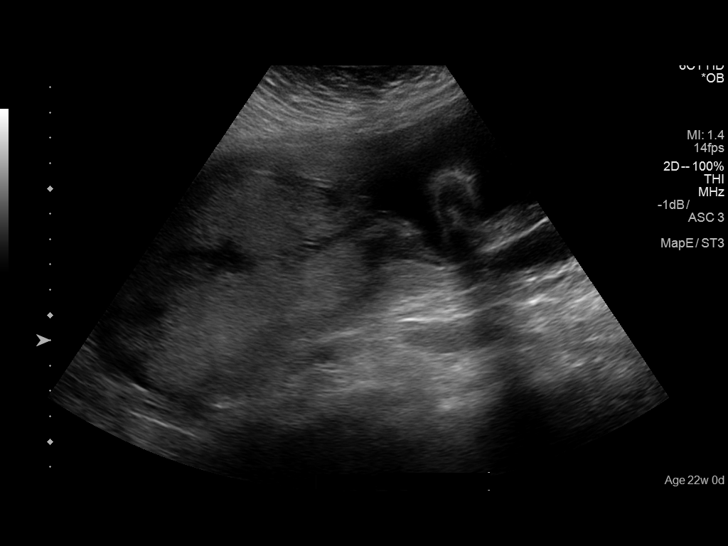
[im 8/28]
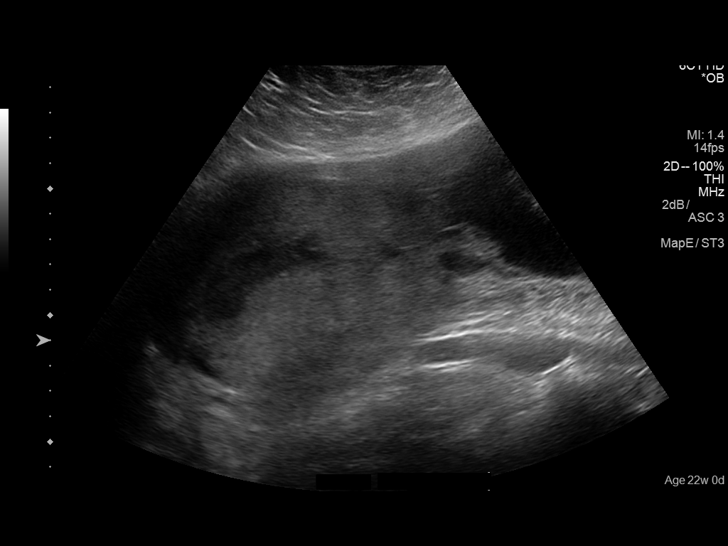
[im 10/28]
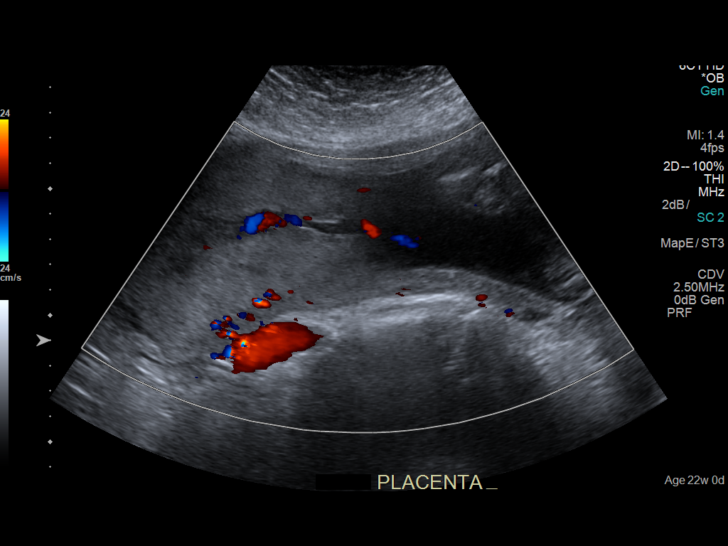
[im 12/28]
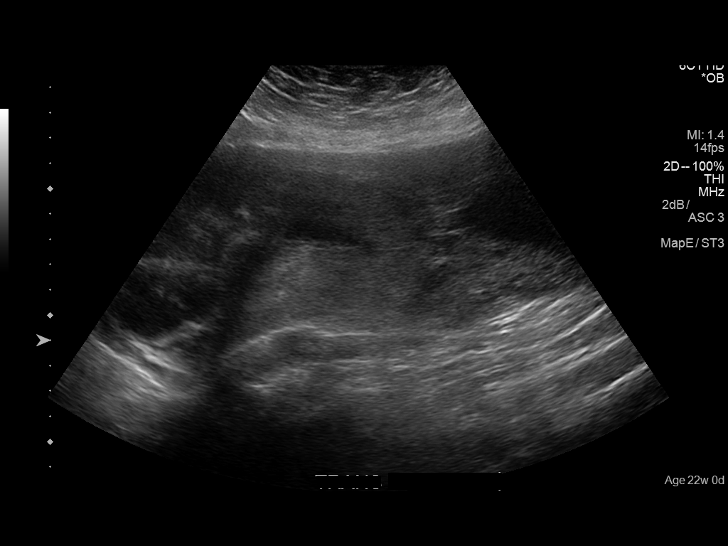
[im 14/28]
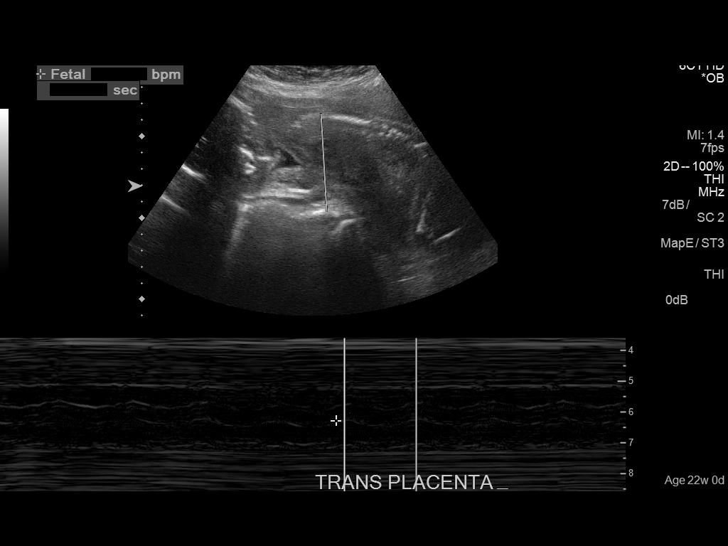
[im 16/28]
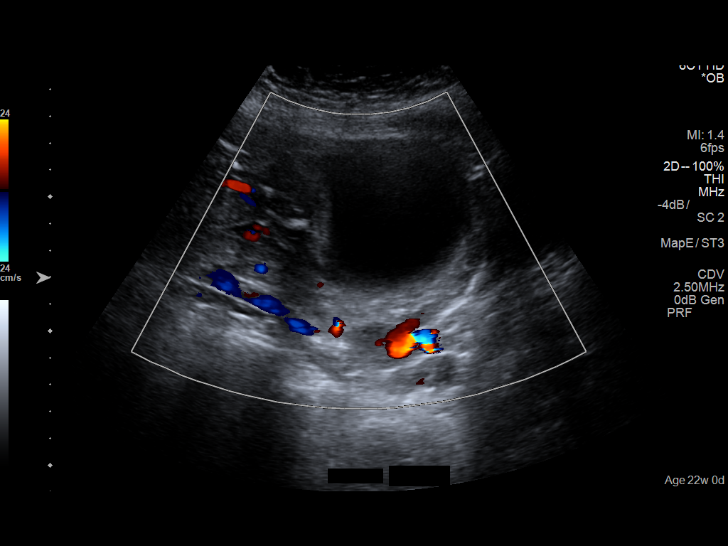
[im 18/28]
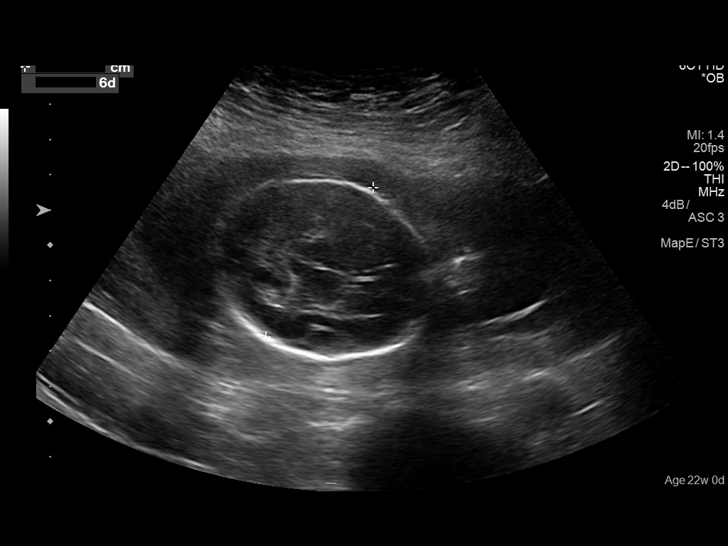
[im 20/28]
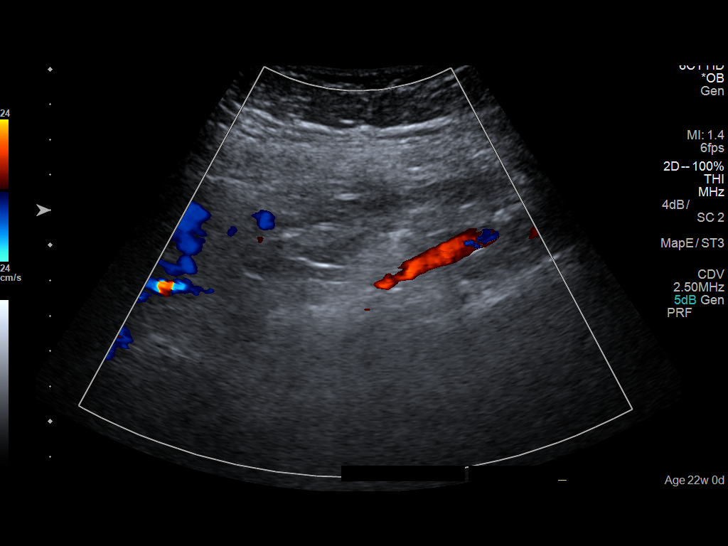
[im 22/28]
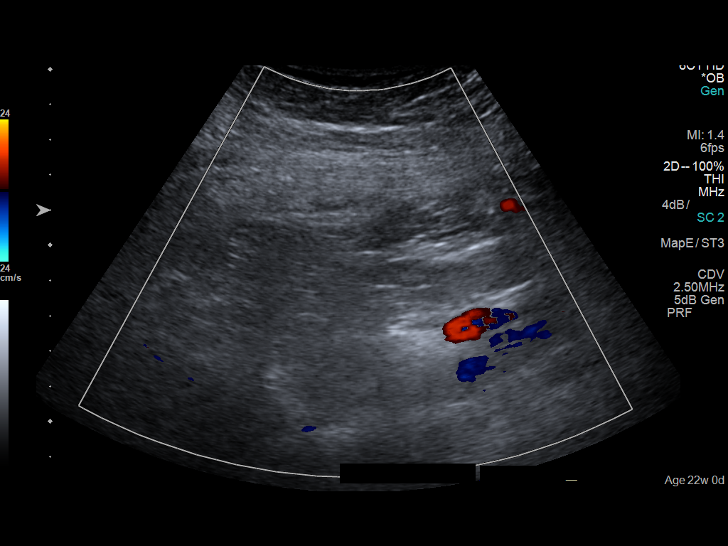
[im 24/28]
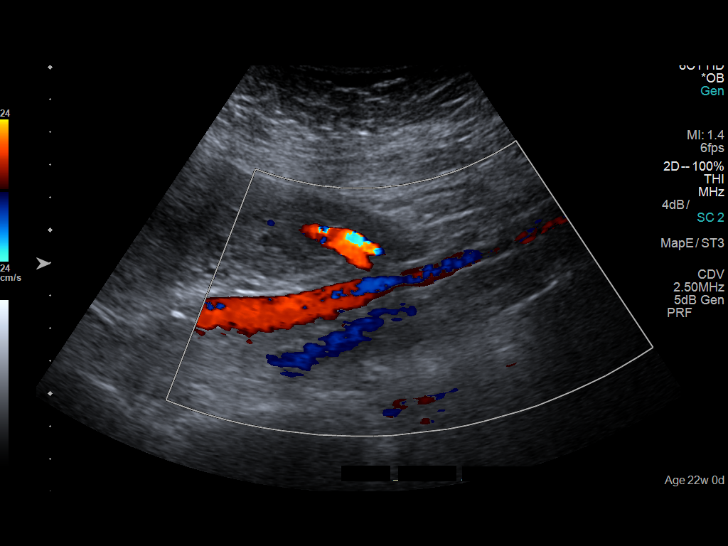
[im 26/28]
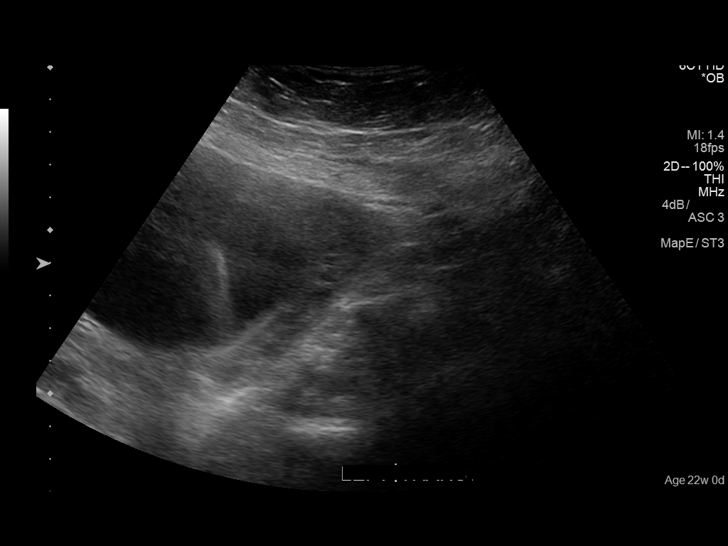
[im 28/28]
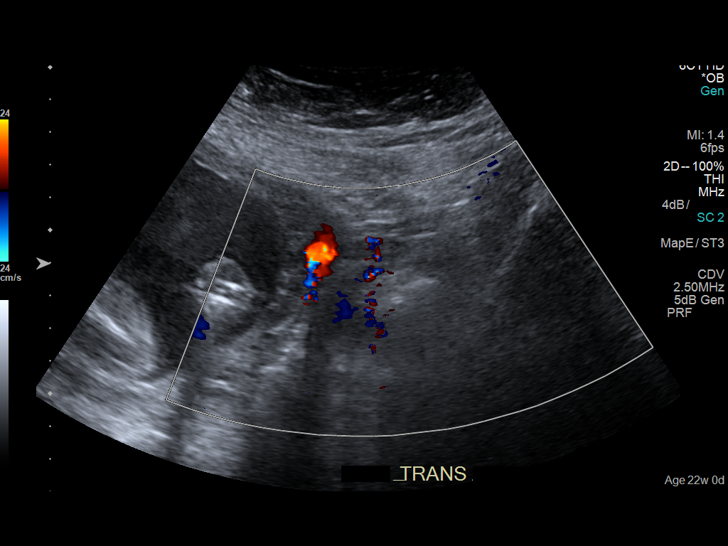

[14 of 28 positions shown; findings below may reference images not displayed]

FINDINGS: Number of Fetuses: 1

Heart Rate:  143 bpm

Movement: Present

Presentation: Breech

Placental Location: Fundal and posterior

Previa: No

Amniotic Fluid (Subjective):  Within normal limits.

BPD:  5.22cm 21w  6d

MATERNAL FINDINGS:

Cervix:  Appears closed.

Uterus/Adnexae: No abnormality visualized.
IMPRESSION: Unremarkable exam. Single fetus without apparent complications,
approximately 22 weeks of age.

This exam is performed on an emergent basis and does not
comprehensively evaluate fetal size, dating, or anatomy; follow-up
complete OB US should be considered if further fetal assessment is
warranted.
